# Patient Record
Sex: Female | Born: 1995 | Race: White | Hispanic: No | Marital: Single | Smoking: Never smoker
Health system: Southern US, Community
[De-identification: ages and names within clinical notes are randomized; demographics above are authoritative.]

## PROBLEM LIST (undated history)

## (undated) DIAGNOSIS — R51 Headache: Secondary | ICD-10-CM

## (undated) DIAGNOSIS — F32A Depression, unspecified: Secondary | ICD-10-CM

## (undated) HISTORY — DX: Depression, unspecified: F32.A

## (undated) HISTORY — DX: Headache: R51

---
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
## 2017-02-17 HISTORY — PX: APPENDECTOMY: SHX54

## 2017-12-15 ENCOUNTER — Emergency Department: Payer: BLUE CROSS/BLUE SHIELD

## 2017-12-15 ENCOUNTER — Inpatient Hospital Stay
Admission: EM | Admit: 2017-12-15 | Discharge: 2017-12-19 | DRG: 373 | Disposition: A | Discharge status: Home / Self Care | Payer: BLUE CROSS/BLUE SHIELD | Attending: Surgery | Admitting: Surgery

## 2017-12-15 ENCOUNTER — Encounter: Payer: Self-pay | Admitting: Emergency Medicine

## 2017-12-15 ENCOUNTER — Other Ambulatory Visit: Payer: Self-pay

## 2017-12-15 DIAGNOSIS — K3532 Acute appendicitis with perforation and localized peritonitis, without abscess: Secondary | ICD-10-CM | POA: Diagnosis not present

## 2017-12-15 DIAGNOSIS — Z880 Allergy status to penicillin: Secondary | ICD-10-CM

## 2017-12-15 DIAGNOSIS — Z6837 Body mass index (BMI) 37.0-37.9, adult: Secondary | ICD-10-CM | POA: Diagnosis not present

## 2017-12-15 DIAGNOSIS — K5900 Constipation, unspecified: Secondary | ICD-10-CM | POA: Diagnosis present

## 2017-12-15 DIAGNOSIS — K3580 Unspecified acute appendicitis: Secondary | ICD-10-CM | POA: Diagnosis present

## 2017-12-15 DIAGNOSIS — R51 Headache: Secondary | ICD-10-CM | POA: Diagnosis present

## 2017-12-15 DIAGNOSIS — E669 Obesity, unspecified: Secondary | ICD-10-CM | POA: Diagnosis present

## 2017-12-15 DIAGNOSIS — Z882 Allergy status to sulfonamides status: Secondary | ICD-10-CM

## 2017-12-15 LAB — COMPREHENSIVE METABOLIC PANEL
ALK PHOS: 69 U/L (ref 38–126)
ALT: 16 U/L (ref 0–44)
AST: 16 U/L (ref 15–41)
Albumin: 4.2 g/dL (ref 3.5–5.0)
Anion gap: 9 (ref 5–15)
BUN: 9 mg/dL (ref 6–20)
CALCIUM: 9.3 mg/dL (ref 8.9–10.3)
CHLORIDE: 102 mmol/L (ref 98–111)
CO2: 27 mmol/L (ref 22–32)
CREATININE: 0.58 mg/dL (ref 0.44–1.00)
GFR calc Af Amer: >60 mL/min (ref >60)
GFR calc non Af Amer: >60 mL/min (ref >60)
GLUCOSE: 95 mg/dL (ref 70–99)
Potassium: 3.9 mmol/L (ref 3.5–5.1)
SODIUM: 138 mmol/L (ref 135–145)
Total Bilirubin: 0.6 mg/dL (ref 0.3–1.2)
Total Protein: 7.1 g/dL (ref 6.5–8.1)

## 2017-12-15 LAB — URINALYSIS, COMPLETE (UACMP) WITH MICROSCOPIC
BILIRUBIN URINE: NEGATIVE
Bacteria, UA: NONE SEEN
GLUCOSE, UA: NEGATIVE mg/dL
Ketones, ur: 20 mg/dL — AB
NITRITE: NEGATIVE
PH: 6 (ref 5.0–8.0)
Protein, ur: NEGATIVE mg/dL
SPECIFIC GRAVITY, URINE: 1.017 (ref 1.005–1.030)

## 2017-12-15 LAB — CBC
HCT: 40 % (ref 36.0–46.0)
Hemoglobin: 12.8 g/dL (ref 12.0–15.0)
MCH: 27.5 pg (ref 26.0–34.0)
MCHC: 32 g/dL (ref 30.0–36.0)
MCV: 86 fL (ref 80.0–100.0)
PLATELETS: 311 10*3/uL (ref 150–400)
RBC: 4.65 MIL/uL (ref 3.87–5.11)
RDW: 13.2 % (ref 11.5–15.5)
WBC: 13.9 10*3/uL — ABNORMAL HIGH (ref 4.0–10.5)
nRBC: 0 % (ref 0.0–0.2)

## 2017-12-15 LAB — POCT PREGNANCY, URINE: Preg Test, Ur: NEGATIVE

## 2017-12-15 LAB — LIPASE, BLOOD: LIPASE: 23 U/L (ref 11–51)

## 2017-12-15 MED ORDER — IOPAMIDOL (ISOVUE-300) INJECTION 61%
100.0000 mL | Freq: Once | INTRAVENOUS | Status: AC | PRN
  Administered 2017-12-15: 100 mL via INTRAVENOUS
  Filled 2017-12-15: qty 100

## 2017-12-15 MED ORDER — MEROPENEM 1 G IV SOLR
1.0000 g | Freq: Three times a day (TID) | INTRAVENOUS | Status: DC
  Administered 2017-12-16 – 2017-12-18 (×7): 1 g via INTRAVENOUS
  Filled 2017-12-15 (×10): qty 1

## 2017-12-15 NOTE — Consult Note (Signed)
Subjective:   CC: perforated appendicitis  HPI:  Taylor Wang is a 22 y.o. female who is consulted by [facility] for evaluation of  above cc.  Symptoms were first noted 10 days ago. Pain is sharp, waxing and waning  Associated with constipation, exacerbated by movement.  Last BM couple days ago.  Some associated nausea but no emesis.     Past Medical History:  has a past medical history of Headache(784.0).  Past Surgical History:  has a past surgical history that includes Adenoidectomy, tonsillectomy and myringotomy with tube placement (Bilateral, 05/2002) and Tympanostomy tube placement (Bilateral, 06/2006).  Family History: family history includes ADD / ADHD in her sister; Migraines in her maternal aunt.  Social History:  reports that she has never smoked. She has never used smokeless tobacco. She reports that she does not drink alcohol or use drugs.  Current Medications: none reported  Allergies:  Allergies as of 12/15/2017 - Review Complete 12/15/2017  Allergen Reaction Noted  . Other  03/17/2013  . Penicillins Swelling 12/15/2017  . Sulfa antibiotics Swelling 12/15/2017    ROS:  A 15 point review of systems was performed and pertinent positives and negatives noted in HPI    Objective:     BP (!) 145/81 (BP Location: Left Arm)   Pulse 87   Temp 98.6 F (37 C) (Oral)   Resp 18   Ht 5\' 5"  (1.651 m)   Wt 103 kg   LMP 11/04/2017 (Approximate)   SpO2 97%   BMI 37.77 kg/m    Constitutional :  alert, cooperative, appears stated age, no distress and moderately obese  Lymphatics/Throat:  no asymmetry, masses, or scars  Respiratory:  clear to auscultation bilaterally  Cardiovascular:  regular rate and rhythm  Gastrointestinal: soft, no guarding, but focal tenderness in RLQ with some pain with movement.   Musculoskeletal: Steady gait and movement  Skin: Cool and moist  Psychiatric: Normal affect, non-agitated, not confused       LABS:  CMP Latest Ref Rng & Units  12/15/2017  Glucose 70 - 99 mg/dL 95  BUN 6 - 20 mg/dL 9  Creatinine 9.62 - 9.52 mg/dL 8.41  Sodium 324 - 401 mmol/L 138  Potassium 3.5 - 5.1 mmol/L 3.9  Chloride 98 - 111 mmol/L 102  CO2 22 - 32 mmol/L 27  Calcium 8.9 - 10.3 mg/dL 9.3  Total Protein 6.5 - 8.1 g/dL 7.1  Total Bilirubin 0.3 - 1.2 mg/dL 0.6  Alkaline Phos 38 - 126 U/L 69  AST 15 - 41 U/L 16  ALT 0 - 44 U/L 16   CBC Latest Ref Rng & Units 12/15/2017  WBC 4.0 - 10.5 K/uL 13.9(H)  Hemoglobin 12.0 - 15.0 g/dL 02.7  Hematocrit 25.3 - 46.0 % 40.0  Platelets 150 - 400 K/uL 311     RADS: Pending final read by review by myself looks like perforated appendicitis with phelgmon, no obviously organized abscess, no free air. Assessment:      Perforated appendicitis, no signs of overt peritonitis.  Plan:      Due to perforation, will forego operative intervention and start IV abx and serial abdominal exams.  Will consider repeat CT prior to d/c to see if phelgmon forms into drainable organized abscess.  NPO for tonight.

## 2017-12-15 NOTE — ED Provider Notes (Signed)
[facility] Emergency Department Provider Note       Time seen: ----------------------------------------- 9:22 PM on 12/15/2017 -----------------------------------------   I have reviewed the triage vital signs and the nursing notes.  HISTORY   Chief Complaint Abdominal Pain    HPI MAN BONNEAU is a 22 y.o. female with a history of headaches who presents to the ED for right lower quadrant pain for the past 10 days.  She denies any  vomiting or diarrhea.  Pain is worse with palpation or movement.  [facility] and told to come to the ER for further evaluation.  She has had some chills.  She denies any vaginal bleeding or discharge.  Past Medical History:  Diagnosis Date  . ZOXWRUEA(540.9)     Patient Active Problem List   Diagnosis Date Noted  . Migraine without aura and without status migrainosus, not intractable 03/17/2013  . Tension headache 03/17/2013  . Chronic daily headache 03/17/2013    Past Surgical History:  Procedure Laterality Date  . ADENOIDECTOMY, TONSILLECTOMY AND MYRINGOTOMY WITH TUBE PLACEMENT Bilateral 05/2002  . TYMPANOSTOMY TUBE PLACEMENT Bilateral 06/2006    Allergies Other; Penicillins; and Sulfa antibiotics  Social History Social History   Tobacco Use  . Smoking status: Never Smoker  . Smokeless tobacco: Never Used  Substance Use Topics  . Alcohol use: No  . Drug use: No   Review of Systems Constitutional: Negative for fever. Cardiovascular: Negative for chest pain. Respiratory: Negative for shortness of breath. Gastrointestinal: Positive right lower quadrant abdominal pain Genitourinary: Negative for dysuria. Musculoskeletal: Negative for back pain. Skin: Negative for rash. Neurological: Negative for headaches, focal weakness or numbness.  All systems negative/normal/unremarkable except as stated in the HPI  ____________________________________________   PHYSICAL EXAM:  VITAL  SIGNS: ED Triage Vitals [12/15/17 2008]  Enc Vitals Group     BP (!) 145/81     Pulse Rate 87     Resp 18     Temp 98.6 F (37 C)     Temp Source Oral     SpO2 97 %     Weight 227 lb (103 kg)     Height 5\' 5"  (1.651 m)     Head Circumference      Peak Flow      Pain Score 9     Pain Loc      Pain Edu?      Excl. in GC?    Constitutional: Alert and oriented. Well appearing and in no distress. Eyes: Conjunctivae are normal. Normal extraocular movements. ENT   Head: Normocephalic and atraumatic.   Nose: No congestion/rhinnorhea.   Mouth/Throat: Mucous membranes are moist.   Neck: No stridor. Cardiovascular: Normal rate, regular rhythm. No murmurs, rubs, or gallops. Respiratory: Normal respiratory effort without tachypnea nor retractions. Breath sounds are clear and equal bilaterally. No wheezes/rales/rhonchi. Gastrointestinal: Right lower quadrant tenderness, no rebound or guarding.  Normal bowel sounds. Musculoskeletal: Nontender with normal range of motion in extremities. No lower extremity tenderness nor edema. Neurologic:  Normal speech and language. No gross focal neurologic deficits are appreciated.  Skin:  Skin is warm, dry and intact. No rash noted. Psychiatric: Mood and affect are normal. Speech and behavior are normal.  ____________________________________________  ED COURSE:  As part of my medical decision making, I reviewed the following data within the electronic MEDICAL RECORD NUMBER History obtained from family if available, nursing notes, old chart and ekg, as well as notes from prior ED visits.  Patient presented for right lower quadrant abdominal pain, we will assess with labs and imaging as indicated at this time.   Procedures ____________________________________________   LABS (pertinent positives/negatives)  Labs Reviewed  CBC - Abnormal; Notable for the following components:      Result Value   WBC 13.9 (*)    All other components within  normal limits  URINALYSIS, COMPLETE (UACMP) WITH MICROSCOPIC - Abnormal; Notable for the following components:   Color, Urine YELLOW (*)    APPearance CLEAR (*)    Hgb urine dipstick SMALL (*)    Ketones, ur 20 (*)    Leukocytes, UA TRACE (*)    All other components within normal limits  LIPASE, BLOOD  COMPREHENSIVE METABOLIC PANEL  POCT PREGNANCY, URINE  POC URINE PREG, ED    RADIOLOGY Images were viewed by me  CT of the abdomen pelvis with contrast Reveals appendicitis ____________________________________________  DIFFERENTIAL DIAGNOSIS   Renal colic, UTI, pyelonephritis, appendicitis, ovarian cyst, torsion  FINAL ASSESSMENT AND PLAN  Right lower quadrant pain, appendicitis   Plan: The patient had presented for right lower quadrant pain. Patient's labs did reveal leukocytosis with a white count of 13.9. Patient's imaging did reveal a right lower quadrant appendicitis.  We have ordered IV meropenem for her due to her penicillin allergy.  I have discussed with general surgery for admission.   Ulice Dash, MD   Note: This note was generated in part or whole with voice recognition software. Voice recognition is usually quite accurate but there are transcription errors that can and very often do occur. I apologize for any typographical errors that were not detected and corrected.     Emily Filbert, MD 12/15/17 2206

## 2017-12-15 NOTE — ED Triage Notes (Signed)
Pt presents to ED with right lower abd pain for the past 10+/- days. Denies vomiting. Tender with palpation. [facility] and told to come to ED for further evaluation.

## 2017-12-15 NOTE — ED Notes (Signed)
Sherilyn Cooter RN called the pharmacy to request the ordered medication for the Pt. Pharmacy states that they will send it if they have it. They will call if they do not have it.

## 2017-12-16 ENCOUNTER — Other Ambulatory Visit: Payer: Self-pay

## 2017-12-16 LAB — BASIC METABOLIC PANEL
Anion gap: 10 (ref 5–15)
BUN: 6 mg/dL (ref 6–20)
CHLORIDE: 104 mmol/L (ref 98–111)
CO2: 26 mmol/L (ref 22–32)
CREATININE: 0.47 mg/dL (ref 0.44–1.00)
Calcium: 8.9 mg/dL (ref 8.9–10.3)
GFR calc Af Amer: >60 mL/min (ref >60)
GLUCOSE: 83 mg/dL (ref 70–99)
Potassium: 3.4 mmol/L — ABNORMAL LOW (ref 3.5–5.1)
SODIUM: 140 mmol/L (ref 135–145)

## 2017-12-16 LAB — CBC
HEMATOCRIT: 37 % (ref 36.0–46.0)
Hemoglobin: 12.1 g/dL (ref 12.0–15.0)
MCH: 27.8 pg (ref 26.0–34.0)
MCHC: 32.7 g/dL (ref 30.0–36.0)
MCV: 85.1 fL (ref 80.0–100.0)
PLATELETS: 282 10*3/uL (ref 150–400)
RBC: 4.35 MIL/uL (ref 3.87–5.11)
RDW: 13.2 % (ref 11.5–15.5)
WBC: 13.1 10*3/uL — ABNORMAL HIGH (ref 4.0–10.5)
nRBC: 0 % (ref 0.0–0.2)

## 2017-12-16 LAB — MAGNESIUM: Magnesium: 2.2 mg/dL (ref 1.7–2.4)

## 2017-12-16 LAB — PHOSPHORUS: Phosphorus: 2.9 mg/dL (ref 2.5–4.6)

## 2017-12-16 MED ORDER — ONDANSETRON 4 MG PO TBDP: 4.0000 mg | ORAL_TABLET | Freq: Four times a day (QID) | ORAL | Status: DC | PRN

## 2017-12-16 MED ORDER — TRAMADOL HCL 50 MG PO TABS: 50.0000 mg | ORAL_TABLET | Freq: Four times a day (QID) | ORAL | Status: DC | PRN

## 2017-12-16 MED ORDER — ACETAMINOPHEN 325 MG PO TABS
650.0000 mg | ORAL_TABLET | Freq: Four times a day (QID) | ORAL | Status: DC | PRN
  Administered 2017-12-16 – 2017-12-18 (×3): 650 mg via ORAL
  Filled 2017-12-16 (×3): qty 2

## 2017-12-16 MED ORDER — HYDROCODONE-ACETAMINOPHEN 5-325 MG PO TABS: 1.0000 | ORAL_TABLET | ORAL | Status: DC | PRN

## 2017-12-16 MED ORDER — FLUTICASONE PROPIONATE 50 MCG/ACT NA SUSP
2.0000 | Freq: Every day | NASAL | Status: DC | PRN
  Filled 2017-12-16: qty 16

## 2017-12-16 MED ORDER — DOCUSATE SODIUM 100 MG PO CAPS
100.0000 mg | ORAL_CAPSULE | Freq: Two times a day (BID) | ORAL | Status: DC | PRN
  Administered 2017-12-18: 100 mg via ORAL
  Filled 2017-12-16: qty 1

## 2017-12-16 MED ORDER — ONDANSETRON HCL 4 MG/2ML IJ SOLN: 4.0000 mg | Freq: Four times a day (QID) | INTRAMUSCULAR | Status: DC | PRN

## 2017-12-16 MED ORDER — LACTATED RINGERS IV SOLN
INTRAVENOUS | Status: DC
  Administered 2017-12-16 – 2017-12-17 (×5): via INTRAVENOUS

## 2017-12-16 MED ORDER — PROPRANOLOL HCL 20 MG PO TABS
30.0000 mg | ORAL_TABLET | Freq: Two times a day (BID) | ORAL | Status: DC
  Filled 2017-12-16 (×3): qty 1

## 2017-12-16 MED ORDER — ENOXAPARIN SODIUM 40 MG/0.4ML ~~LOC~~ SOLN
40.0000 mg | SUBCUTANEOUS | Status: DC
  Administered 2017-12-16 – 2017-12-18 (×3): 40 mg via SUBCUTANEOUS
  Filled 2017-12-16 (×3): qty 0.4

## 2017-12-16 MED ORDER — MORPHINE SULFATE (PF) 2 MG/ML IV SOLN
2.0000 mg | INTRAVENOUS | Status: DC | PRN
  Administered 2017-12-16 (×4): 2 mg via INTRAVENOUS
  Filled 2017-12-16 (×4): qty 1

## 2017-12-16 MED ORDER — LORATADINE 10 MG PO TABS
10.0000 mg | ORAL_TABLET | Freq: Every day | ORAL | Status: DC
  Filled 2017-12-16 (×4): qty 1

## 2017-12-16 NOTE — Progress Notes (Signed)
Subjective:  CC:  Taylor Wang is a 22 y.o. female  Hospital stay day 1,   perforated appendicitis  HPI: No acute issues overnight.  Feeling hungry.  ROS:  A 5 point review of systems was performed and pertinent positives and negatives noted in HPI.   Objective:      Temp:  [98.2 F (36.8 C)-98.6 F (37 C)] 98.4 F (36.9 C) (10/30 0841) Pulse Rate:  [77-98] 98 (10/30 0841) Resp:  [16-18] 18 (10/30 0841) BP: (100-145)/(57-81) 112/74 (10/30 0841) SpO2:  [96 %-100 %] 99 % (10/30 0841) Weight:  [161 kg] 103 kg (10/29 2008)     Height: 5\' 5"  (165.1 cm) Weight: 103 kg BMI (Calculated): 37.77   Intake/Output this shift:  Total I/O In: -  Out: 150 [Urine:150]       Constitutional :  alert, cooperative, appears stated age and no distress  Respiratory:  clear to auscultation bilaterally  Cardiovascular:  regular rate and rhythm  Gastrointestinal: soft, but still some tenderness with guarding in RLQ, suprapubic region.   Skin: Cool and moist.   Psychiatric: Normal affect, non-agitated, not confused       LABS:  CMP Latest Ref Rng & Units 12/16/2017 12/15/2017  Glucose 70 - 99 mg/dL 83 95  BUN 6 - 20 mg/dL 6 9  Creatinine 0.96 - 1.00 mg/dL 0.45 4.09  Sodium 811 - 145 mmol/L 140 138  Potassium 3.5 - 5.1 mmol/L 3.4(L) 3.9  Chloride 98 - 111 mmol/L 104 102  CO2 22 - 32 mmol/L 26 27  Calcium 8.9 - 10.3 mg/dL 8.9 9.3  Total Protein 6.5 - 8.1 g/dL - 7.1  Total Bilirubin 0.3 - 1.2 mg/dL - 0.6  Alkaline Phos 38 - 126 U/L - 69  AST 15 - 41 U/L - 16  ALT 0 - 44 U/L - 16   CBC Latest Ref Rng & Units 12/16/2017 12/15/2017  WBC 4.0 - 10.5 K/uL 13.1(H) 13.9(H)  Hemoglobin 12.0 - 15.0 g/dL 91.4 78.2  Hematocrit 95.6 - 46.0 % 37.0 40.0  Platelets 150 - 400 K/uL 282 311    RADS: n/a Assessment:   Perforated appendicitis.  Will continue with serial abdominal exams and IV abx, am labs.  Gum and hard candy ok for now.  May advance to clears later today.    NO plan for  surgery at this point.  Pathophisiology of appendicitis and risk/benefits/alternatives to IV therapy vs surgical intervention(both acute and interval appy) explained in great detail to mother and patient.  All questions answered at this point and both are in agreement with continuing IV abx for now.  I specifically stated they can always reach out to me with any additional questions/concerns since there was some initial confusion about why surgery was not performed right away.

## 2017-12-17 LAB — CBC
HCT: 37 % (ref 36.0–46.0)
HEMOGLOBIN: 12.2 g/dL (ref 12.0–15.0)
MCH: 27.9 pg (ref 26.0–34.0)
MCHC: 33 g/dL (ref 30.0–36.0)
MCV: 84.5 fL (ref 80.0–100.0)
PLATELETS: 257 10*3/uL (ref 150–400)
RBC: 4.38 MIL/uL (ref 3.87–5.11)
RDW: 12.8 % (ref 11.5–15.5)
WBC: 12.9 10*3/uL — AB (ref 4.0–10.5)
nRBC: 0 % (ref 0.0–0.2)

## 2017-12-17 LAB — BASIC METABOLIC PANEL
ANION GAP: 11 (ref 5–15)
BUN: <5 mg/dL — ABNORMAL LOW (ref 6–20)
CALCIUM: 8.4 mg/dL — AB (ref 8.9–10.3)
CO2: 21 mmol/L — ABNORMAL LOW (ref 22–32)
CREATININE: 0.38 mg/dL — AB (ref 0.44–1.00)
Chloride: 104 mmol/L (ref 98–111)
GFR calc Af Amer: >60 mL/min (ref >60)
GLUCOSE: 72 mg/dL (ref 70–99)
Potassium: 3.6 mmol/L (ref 3.5–5.1)
Sodium: 136 mmol/L (ref 135–145)

## 2017-12-17 LAB — HIV ANTIBODY (ROUTINE TESTING W REFLEX): HIV SCREEN 4TH GENERATION: NONREACTIVE

## 2017-12-17 LAB — PHOSPHORUS: Phosphorus: 2.2 mg/dL — ABNORMAL LOW (ref 2.5–4.6)

## 2017-12-17 LAB — MAGNESIUM: Magnesium: 2 mg/dL (ref 1.7–2.4)

## 2017-12-17 MED ORDER — SODIUM PHOSPHATES 45 MMOLE/15ML IV SOLN
10.0000 mmol | Freq: Once | INTRAVENOUS | Status: AC
  Administered 2017-12-17: 10 mmol via INTRAVENOUS
  Filled 2017-12-17: qty 3.33

## 2017-12-17 NOTE — Progress Notes (Signed)
Responded to PIV consult. RN states PIV obtained by another nurse.

## 2017-12-18 LAB — CBC
HEMATOCRIT: 36.5 % (ref 36.0–46.0)
Hemoglobin: 11.9 g/dL — ABNORMAL LOW (ref 12.0–15.0)
MCH: 27.5 pg (ref 26.0–34.0)
MCHC: 32.6 g/dL (ref 30.0–36.0)
MCV: 84.5 fL (ref 80.0–100.0)
Platelets: 279 10*3/uL (ref 150–400)
RBC: 4.32 MIL/uL (ref 3.87–5.11)
RDW: 12.8 % (ref 11.5–15.5)
WBC: 8.2 10*3/uL (ref 4.0–10.5)
nRBC: 0 % (ref 0.0–0.2)

## 2017-12-18 LAB — BASIC METABOLIC PANEL
ANION GAP: 10 (ref 5–15)
BUN: <5 mg/dL — ABNORMAL LOW (ref 6–20)
CALCIUM: 8.5 mg/dL — AB (ref 8.9–10.3)
CO2: 25 mmol/L (ref 22–32)
Chloride: 104 mmol/L (ref 98–111)
Creatinine, Ser: 0.39 mg/dL — ABNORMAL LOW (ref 0.44–1.00)
GFR calc Af Amer: >60 mL/min (ref >60)
GLUCOSE: 77 mg/dL (ref 70–99)
Potassium: 3.1 mmol/L — ABNORMAL LOW (ref 3.5–5.1)
Sodium: 139 mmol/L (ref 135–145)

## 2017-12-18 LAB — MAGNESIUM: Magnesium: 2 mg/dL (ref 1.7–2.4)

## 2017-12-18 LAB — PHOSPHORUS: Phosphorus: 2.6 mg/dL (ref 2.5–4.6)

## 2017-12-18 MED ORDER — METRONIDAZOLE 500 MG PO TABS
500.0000 mg | ORAL_TABLET | Freq: Three times a day (TID) | ORAL | Status: DC
  Administered 2017-12-18 – 2017-12-19 (×3): 500 mg via ORAL
  Filled 2017-12-18 (×4): qty 1

## 2017-12-18 MED ORDER — POTASSIUM CHLORIDE CRYS ER 20 MEQ PO TBCR
30.0000 meq | EXTENDED_RELEASE_TABLET | ORAL | Status: AC
  Administered 2017-12-18 (×2): 30 meq via ORAL
  Filled 2017-12-18 (×2): qty 2

## 2017-12-18 MED ORDER — CIPROFLOXACIN HCL 500 MG PO TABS
500.0000 mg | ORAL_TABLET | Freq: Two times a day (BID) | ORAL | Status: DC
  Administered 2017-12-18 (×2): 500 mg via ORAL
  Filled 2017-12-18 (×3): qty 1

## 2017-12-18 NOTE — Progress Notes (Signed)
Spoke with Dr. Tonna Boehringer about plan of care for patient today: start regular diet, change to PO antibiotic, PO potassium replacement. Patient updated on current plan of care.

## 2017-12-18 NOTE — Progress Notes (Signed)
Subjective:  CC:  Taylor Wang is a 22 y.o. female  Hospital stay day 3,   perforated appendicitis  HPI: No acute issues overnight.  Denies any pain this am.  Tolerated clears yesterday.  ROS:  A 5 point review of systems was performed and pertinent positives and negatives noted in HPI.   Objective:      Temp:  [97.9 F (36.6 C)-98.6 F (37 C)] 98.5 F (36.9 C) (11/01 0904) Pulse Rate:  [72-86] 72 (11/01 0904) Resp:  [18-20] 18 (11/01 0904) BP: (108-114)/(62-78) 112/67 (11/01 0904) SpO2:  [99 %-100 %] 99 % (11/01 0904)     Height: 5\' 5"  (165.1 cm) Weight: 103 kg BMI (Calculated): 37.77   Intake/Output this shift:  Total I/O In: -  Out: 1700 [Urine:1700]       Constitutional :  alert, cooperative, appears stated age and no distress  Respiratory:  clear to auscultation bilaterally  Cardiovascular:  regular rate and rhythm  Gastrointestinal: soft, non-tender; bowel sounds normal; no masses,  no organomegaly.   Skin: Cool and moist.   Psychiatric: Normal affect, non-agitated, not confused       LABS:  CMP Latest Ref Rng & Units 12/18/2017 12/17/2017 12/16/2017  Glucose 70 - 99 mg/dL 77 72 83  BUN 6 - 20 mg/dL <4(I) <3(K) 6  Creatinine 0.44 - 1.00 mg/dL 7.42(V) 9.56(L) 8.75  Sodium 135 - 145 mmol/L 139 136 140  Potassium 3.5 - 5.1 mmol/L 3.1(L) 3.6 3.4(L)  Chloride 98 - 111 mmol/L 104 104 104  CO2 22 - 32 mmol/L 25 21(L) 26  Calcium 8.9 - 10.3 mg/dL 6.4(P) 3.2(R) 8.9  Total Protein 6.5 - 8.1 g/dL - - -  Total Bilirubin 0.3 - 1.2 mg/dL - - -  Alkaline Phos 38 - 126 U/L - - -  AST 15 - 41 U/L - - -  ALT 0 - 44 U/L - - -   CBC Latest Ref Rng & Units 12/18/2017 12/17/2017 12/16/2017  WBC 4.0 - 10.5 K/uL 8.2 12.9(H) 13.1(H)  Hemoglobin 12.0 - 15.0 g/dL 11.9(L) 12.2 12.1  Hematocrit 36.0 - 46.0 % 36.5 37.0 37.0  Platelets 150 - 400 K/uL 279 257 282    RADS: n/a Assessment:   Perforated appendicitis.  Doing well.  Advance to regular and switched to PO abx.  If  no worsening and tolerating regular diet, will likely discharge tomorrow am.

## 2017-12-19 LAB — BASIC METABOLIC PANEL
ANION GAP: 10 (ref 5–15)
BUN: <5 mg/dL — ABNORMAL LOW (ref 6–20)
CO2: 26 mmol/L (ref 22–32)
Calcium: 9.1 mg/dL (ref 8.9–10.3)
Chloride: 104 mmol/L (ref 98–111)
Creatinine, Ser: 0.55 mg/dL (ref 0.44–1.00)
GFR calc non Af Amer: >60 mL/min (ref >60)
Glucose, Bld: 94 mg/dL (ref 70–99)
Potassium: 3.6 mmol/L (ref 3.5–5.1)
SODIUM: 140 mmol/L (ref 135–145)

## 2017-12-19 LAB — CBC
HEMATOCRIT: 40.8 % (ref 36.0–46.0)
HEMOGLOBIN: 13.4 g/dL (ref 12.0–15.0)
MCH: 27.4 pg (ref 26.0–34.0)
MCHC: 32.8 g/dL (ref 30.0–36.0)
MCV: 83.4 fL (ref 80.0–100.0)
Platelets: 310 10*3/uL (ref 150–400)
RBC: 4.89 MIL/uL (ref 3.87–5.11)
RDW: 13.2 % (ref 11.5–15.5)
WBC: 7.6 10*3/uL (ref 4.0–10.5)
nRBC: 0 % (ref 0.0–0.2)

## 2017-12-19 LAB — PHOSPHORUS: PHOSPHORUS: 3.8 mg/dL (ref 2.5–4.6)

## 2017-12-19 LAB — MAGNESIUM: MAGNESIUM: 2 mg/dL (ref 1.7–2.4)

## 2017-12-19 MED ORDER — DOCUSATE SODIUM 100 MG PO CAPS: 100.0000 mg | ORAL_CAPSULE | Freq: Two times a day (BID) | ORAL | 0 refills | Status: DC | PRN

## 2017-12-19 MED ORDER — ACETAMINOPHEN 325 MG PO TABS: 650.0000 mg | ORAL_TABLET | Freq: Four times a day (QID) | ORAL | 0 refills | Status: AC | PRN

## 2017-12-19 MED ORDER — CIPROFLOXACIN HCL 500 MG PO TABS: 500.0000 mg | ORAL_TABLET | Freq: Two times a day (BID) | ORAL | 0 refills | Status: AC

## 2017-12-19 MED ORDER — HYDROCODONE-ACETAMINOPHEN 5-325 MG PO TABS: 1.0000 | ORAL_TABLET | ORAL | 0 refills | Status: DC | PRN

## 2017-12-19 MED ORDER — METRONIDAZOLE 500 MG PO TABS: 500.0000 mg | ORAL_TABLET | Freq: Three times a day (TID) | ORAL | 0 refills | Status: AC

## 2017-12-19 NOTE — Progress Notes (Signed)
Discharge instructions complete and prescriptions sent to pharmacy by provider. Work excuse given to patient to return to work 12/21/17, per provider. Patient verbalizes understanding of teaching. Patient discharged home via wheelchair at 0915.

## 2017-12-19 NOTE — Plan of Care (Signed)
Patient's vital signs stable; saline lock intact and patent; site clear; voiding; good appetite; good po fluids; denies pain this shift; po flagyl and po cipro continued; no nausea; plans for discharge today.

## 2017-12-19 NOTE — Discharge Instructions (Signed)
-  Tylenol and advil as needed for discomfort.  Please alternate between the two every four hours as needed for pain.    -Use narcotics, if prescribed, only when tylenol and motrin is not enough to control pain.  -325-650mg  every 8hrs to max of 4000mg /24hrs (including the 325mg  in every norco dose) for the tylenol.    -Advil up to 800mg  per dose every 8hrs as needed for pain.    - Call office during normal business hours for sudden increase in pain, nausea/vomiting, and/or accompanying fever.  Go to nearest urgent care/ED for afterhour care.

## 2017-12-19 NOTE — Discharge Summary (Signed)
Physician Discharge Summary  Patient ID: Taylor Wang MRN: [id number] DOB/AGE: 1995/12/28 22 y.o.  Admit date: 12/15/2017 Discharge date: 12/19/2017  Admission Diagnoses: perforated appendicitis   Discharge Diagnoses:  Same as above  Discharged Condition: good  Hospital Course: admitted for IV abx treatment of perforated appendicitis.  Pain resolved and diet resumed without any issue, leukocystosis resolved.  D/c on oral abx and f/u in office for discussion of interval lap appy.  Consults: None  Discharge Exam: Blood pressure 111/71, pulse 63, temperature 98.1 F (36.7 C), temperature source Oral, resp. rate 18, height 5\' 5"  (1.651 m), weight 103 kg, last menstrual period 11/04/2017, SpO2 98 %. General appearance: alert, cooperative and no distress GI: soft, non-tender; bowel sounds normal; no masses,  no organomegaly  Disposition:  Discharge disposition: 01-Home or Self Care       Discharge Instructions    Discharge patient   Complete by:  As directed    Discharge disposition:  01-Home or Self Care   Discharge patient date:  12/19/2017     Allergies as of 12/19/2017      Reactions   Other    Seasonal Allergies   Penicillins Swelling   Sulfa Antibiotics Swelling      Medication List    STOP taking these medications   propranolol 20 MG tablet Commonly known as:  INDERAL     TAKE these medications   acetaminophen 325 MG tablet Commonly known as:  TYLENOL Take 2 tablets (650 mg total) by mouth every 6 (six) hours as needed for up to 10 days for headache.   cetirizine 10 MG tablet Commonly known as:  ZYRTEC Take 10 mg by mouth daily as needed for allergies.   ciprofloxacin 500 MG tablet Commonly known as:  CIPRO Take 1 tablet (500 mg total) by mouth 2 (two) times daily for 7 days.   docusate sodium 100 MG capsule Commonly known as:  COLACE Take 1 capsule (100 mg total) by mouth 2 (two) times daily as needed for mild constipation.   fluticasone 50  MCG/ACT nasal spray Commonly known as:  FLONASE Place 2 sprays into both nostrils daily as needed for allergies or rhinitis.   HYDROcodone-acetaminophen 5-325 MG tablet Commonly known as:  NORCO/VICODIN Take 1-2 tablets by mouth every 4 (four) hours as needed for moderate pain.   ibuprofen 800 MG tablet Commonly known as:  ADVIL,MOTRIN Take 800 mg by mouth every 8 (eight) hours as needed.   Magnesium Oxide 500 MG Tabs Take by mouth.   metroNIDAZOLE 500 MG tablet Commonly known as:  FLAGYL Take 1 tablet (500 mg total) by mouth 3 (three) times daily for 7 days.   riboflavin 100 MG Tabs tablet Commonly known as:  VITAMIN B-2 Take 100 mg by mouth daily.      Follow-up Information    Tonna Boehringer, Valerie Cones, DO Follow up in 2 week(s).   Specialty:  Surgery Contact information: [address] [place] [address] [phone]            Total time spent arranging discharge was >25min. Signed: Sung Amabile 12/19/2017, 1:30 PM

## 2018-01-06 DIAGNOSIS — K353 Acute appendicitis with localized peritonitis, without perforation or gangrene: Secondary | ICD-10-CM

## 2018-01-06 HISTORY — DX: Acute appendicitis with localized peritonitis, without perforation or gangrene: K35.30

## 2018-12-24 IMAGING — CT CT ABD-PELV W/ CM
2 of 4 series · 15 of 46 positions shown, 17 images · IV contrast (APPLIED)
Comparison: None.

CLINICAL DATA: Lower abdominal pain for 10 days. Suspect
appendicitis.

EXAM:
CT ABDOMEN AND PELVIS WITH CONTRAST
TECHNIQUE: Multidetector CT imaging of the abdomen and pelvis was performed
using the standard protocol following bolus administration of
intravenous contrast.
CONTRAST:  100mL JXKZ25-XBB IOPAMIDOL (JXKZ25-XBB) INJECTION 61%

[Series 2: axial st · axial · 0.98mm/px · z∈[-749,-309]mm · 12 of 101 slices shown, 14 images]
[im 9/101  soft-tissue]
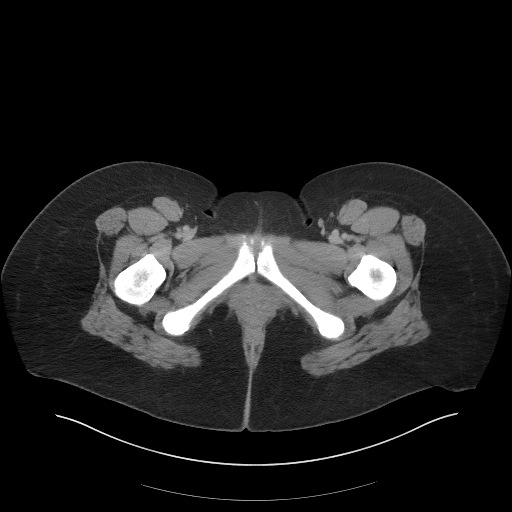
[im 9/101  bone]
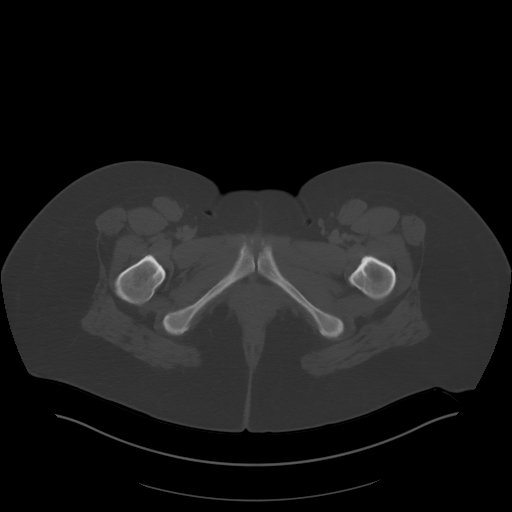
[im 17/101  soft-tissue]
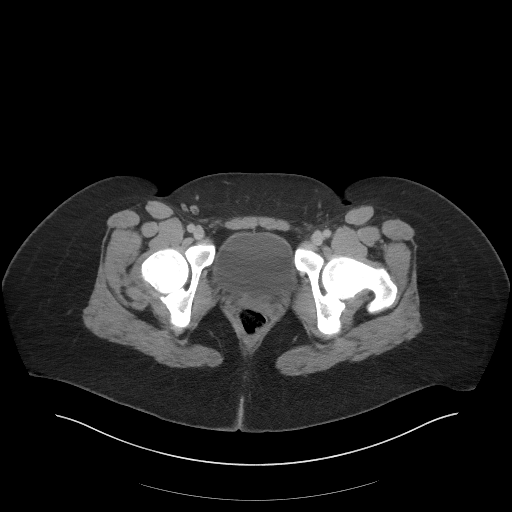
[im 25/101  soft-tissue]
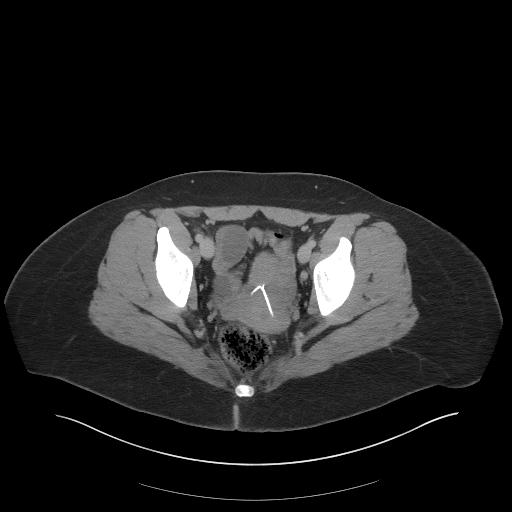
[im 33/101  soft-tissue]
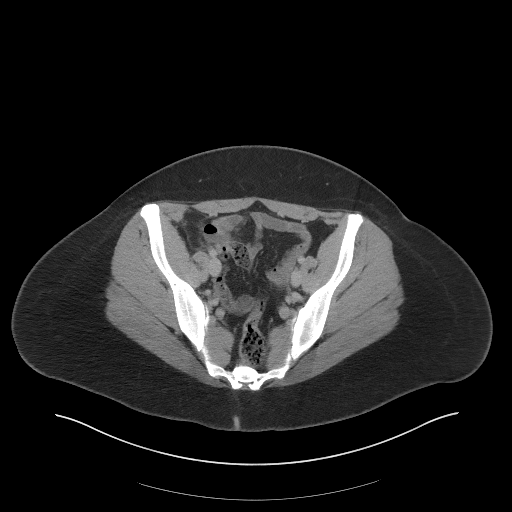
[im 41/101  soft-tissue]
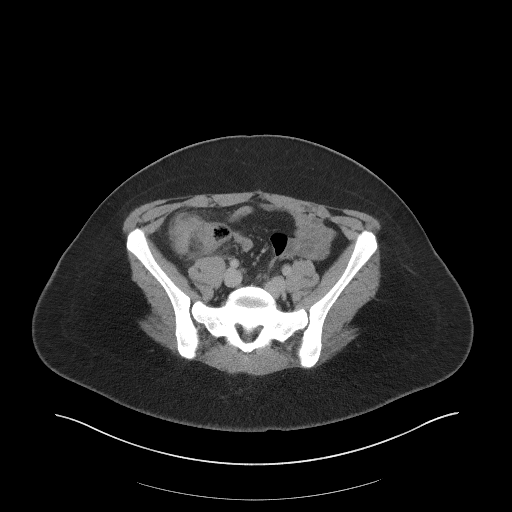
[im 49/101  soft-tissue]
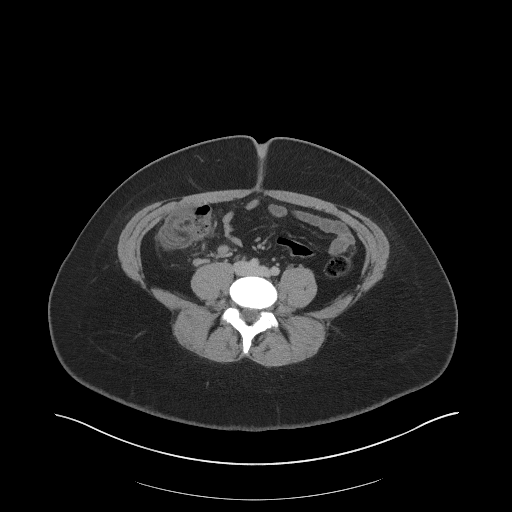
[im 57/101  soft-tissue]
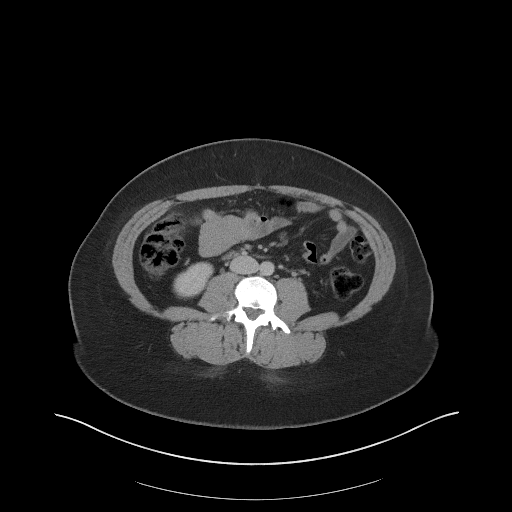
[im 65/101  soft-tissue]
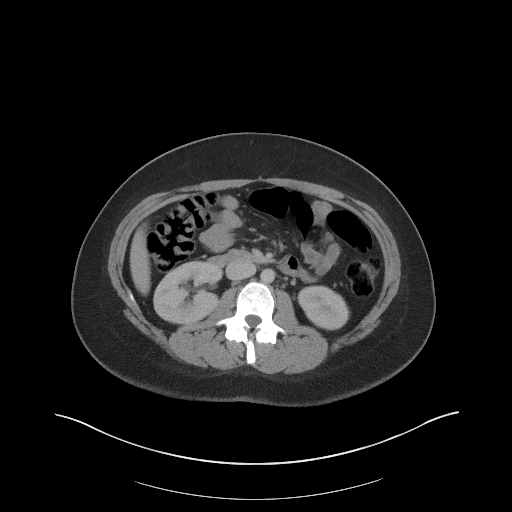
[im 73/101  soft-tissue]
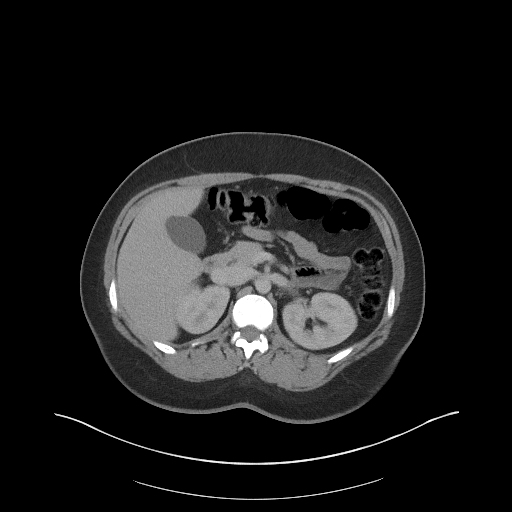
[im 73/101  bone]
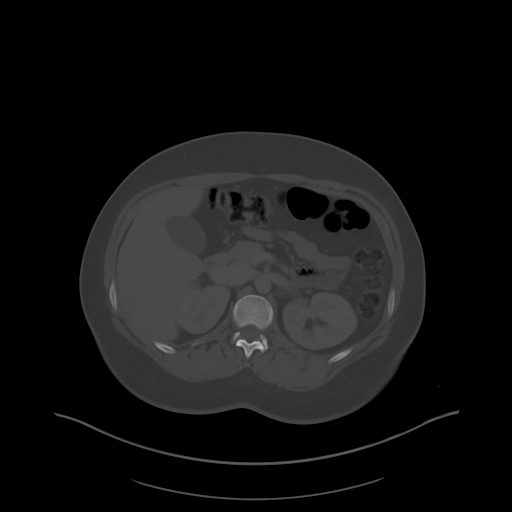
[im 81/101  soft-tissue]
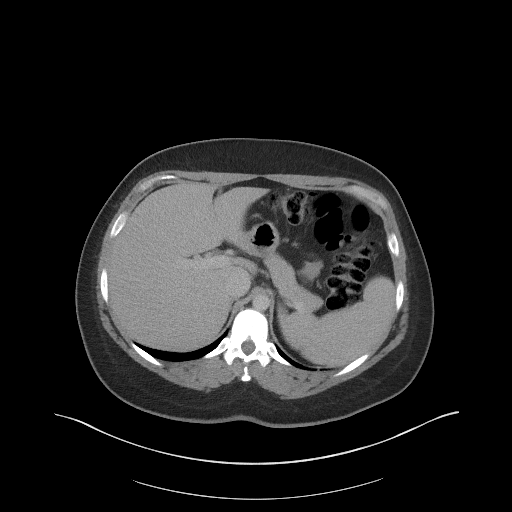
[im 89/101  soft-tissue]
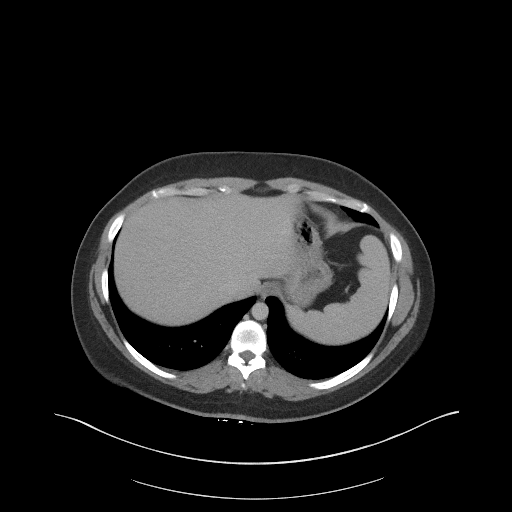
[im 97/101  soft-tissue]
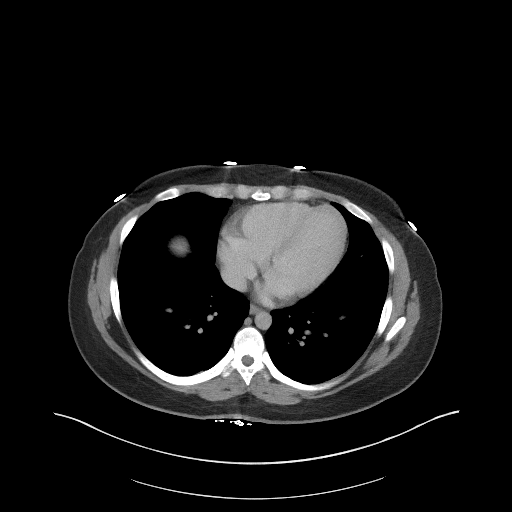

[Series 5: coronal st · coronal · 0.68mm/px · 3 of 91 slices shown]
[im 31/91  soft-tissue]
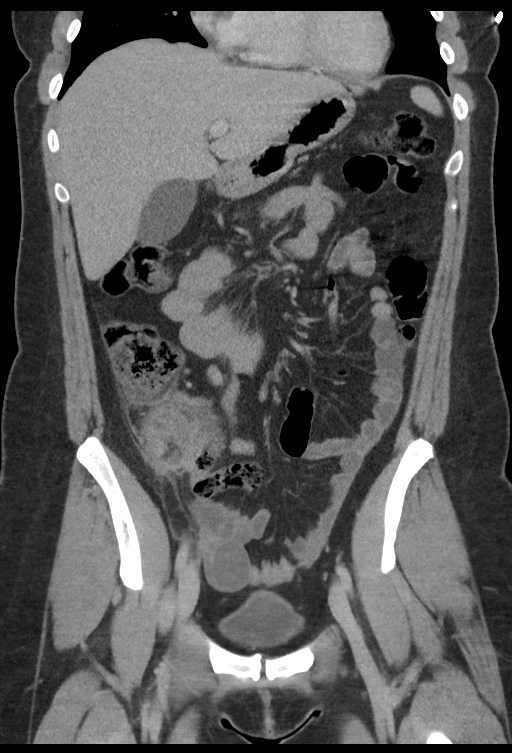
[im 41/91  soft-tissue]
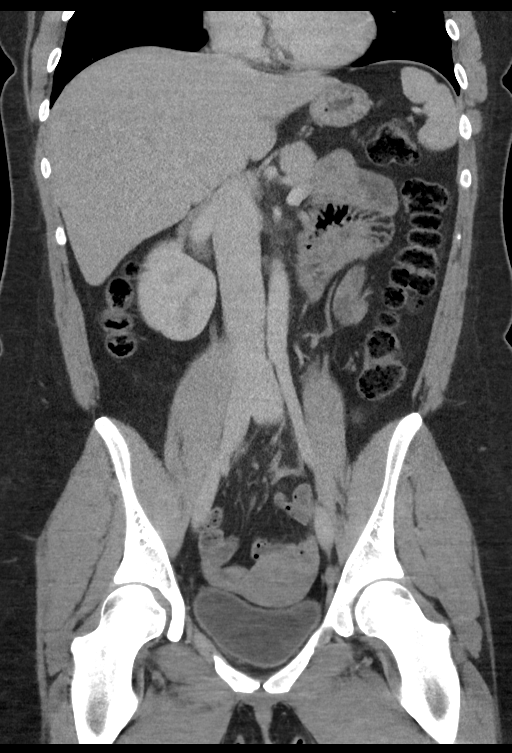
[im 51/91  soft-tissue]
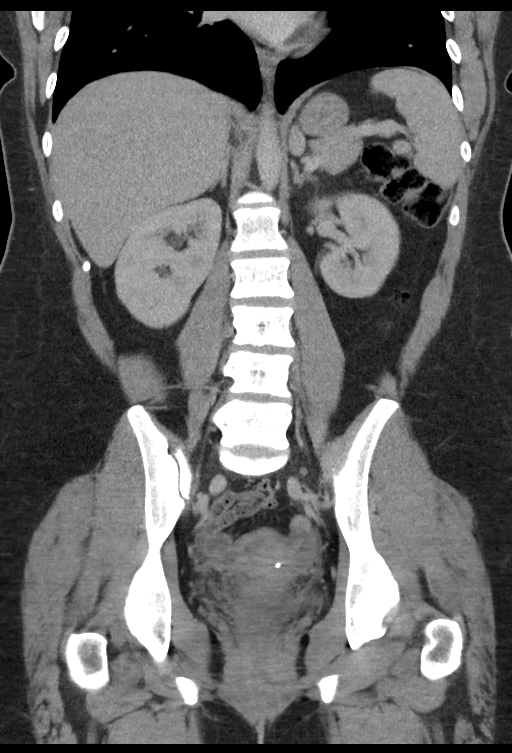

[15 of 46 positions shown; findings below may reference images not displayed]

FINDINGS: LOWER CHEST: Lung bases are clear. Included heart size is normal. No
pericardial effusion.

HEPATOBILIARY: Liver and gallbladder are normal.

PANCREAS: Normal.

SPLEEN: Normal.

ADRENALS/URINARY TRACT: Kidneys are orthotopic, demonstrating
symmetric enhancement. No nephrolithiasis, hydronephrosis or solid
renal masses. The unopacified ureters are normal in course and
caliber. Urinary bladder is partially distended and unremarkable.
Normal adrenal glands.

STOMACH/BOWEL: The stomach, small and large bowel are normal in
course and caliber without inflammatory changes.

Appendix: Location: RIGHT lower quadrant.

Diameter: 15 mm.

Appendicolith: Not present peer

Mucosal hyper-enhancement: Not present

Extraluminal gas: Not present

Periappendiceal collection: Marked periappendiceal fat stranding
without focal fluid collection.

VASCULAR/LYMPHATIC: Aortoiliac vessels are normal in course and
caliber. Greater than expected number of mildly enlarged, presumed
reactive RIGHT lower quadrant mesenteric lymph nodes.

REPRODUCTIVE: IUD displacement with apparent RIGHT myometrial
embedment.

OTHER: No intraperitoneal free fluid or free air.

MUSCULOSKELETAL: Nonacute.
IMPRESSION: 1. Acute appendicitis, possibly perforated without abscess.
2. Suspected IUD migration, recommend non emergent pelvic ultrasound
and gynecologic consultation.
3. Acute findings discussed with and reconfirmed by Dr.DURDANE

## 2019-07-20 ENCOUNTER — Telehealth: Payer: Self-pay | Admitting: Family Medicine

## 2019-07-20 NOTE — Telephone Encounter (Signed)
This RN returned pt's call at phone # provided. Pt reports that she believes she is due to have her Mirena removed and replaced. Per centricity records pt had Mirena placed 08/31/2014, so counseled pt that this July will be 5 years so we could do a removal and reinsertion. Counseled pt since it has been a while since she's been seen we could schedule a physical as well and pt states that would be good. Pt reports that she is not having any issues with Mirena and is satisfied with it has her BCM. Pt reports she would like to come in 09/02/2019. Attempted to schedule pt for that date, but schedule is not open yet. Counseled pt that schedule is not open yet that far in advance, so to give [place] a call back in a couple of weeks to schedule her appt and pt states understanding and she will give [place] a call back. Pt with no other questions or concerns at this time.Lyman Speller, RN

## 2019-07-20 NOTE — Telephone Encounter (Signed)
Patient would like to know when should she get Mirena removed and get a reinsertion.

## 2019-09-02 ENCOUNTER — Other Ambulatory Visit: Payer: Self-pay

## 2019-09-02 ENCOUNTER — Ambulatory Visit (LOCAL_COMMUNITY_HEALTH_CENTER): Payer: BC Managed Care – PPO | Admitting: Physician Assistant

## 2019-09-02 ENCOUNTER — Ambulatory Visit: Payer: BC Managed Care – PPO

## 2019-09-02 ENCOUNTER — Encounter: Payer: Self-pay | Admitting: Physician Assistant

## 2019-09-02 VITALS — BP 135/84 | Ht 66.0 in | Wt 263.0 lb

## 2019-09-02 DIAGNOSIS — Z30431 Encounter for routine checking of intrauterine contraceptive device: Secondary | ICD-10-CM

## 2019-09-02 DIAGNOSIS — Z0000 Encounter for general adult medical examination without abnormal findings: Secondary | ICD-10-CM | POA: Diagnosis not present

## 2019-09-02 DIAGNOSIS — Z3009 Encounter for other general counseling and advice on contraception: Secondary | ICD-10-CM

## 2019-09-02 NOTE — Progress Notes (Signed)
Pt to clinic for physical and IUD removal/reinsertion. Pt states she is not having any problems with Mirena IUD; pt desires to have IUD and insertion done on the same day.

## 2019-09-02 NOTE — Progress Notes (Signed)
ROI reviewed and signed for last PAP result and physical exam records for 2021, faxed to [facility]. Provider orders completed.

## 2019-09-04 ENCOUNTER — Encounter: Payer: Self-pay | Admitting: Physician Assistant

## 2019-09-04 NOTE — Progress Notes (Signed)
Family Planning Visit- Initial Visit  Subjective:  Taylor Wang is a 24 y.o.  [id number]   being seen today for an initial well woman visit and to discuss family planning options.  She is currently using *Mirena for pregnancy prevention. Patient reports she does not want a pregnancy in the next year.  Patient has the following medical conditions has Migraine without aura and without status migrainosus, not intractable; Tension headache; Chronic daily headache; and Appendicitis with perforation on their problem list.  Chief Complaint  Patient presents with  . Contraception    PE    Patient reports that she is due to have her IUD removed and wants removal/reinsertioin at same time.  Reports that she has depression and anxiety that she is followed by her PCP/therapist for and is doing well with her medicine.  States that she had PE and pap in February of this year.  States that she has migraines that are usually related to stress.  Reports concern about weight gain.    Patient denies any changes to personal or family history or other concerns today.    Body mass index is 42.45 kg/m. - Patient is eligible for diabetes screening based on BMI and age >59?  not applicable HA1C ordered? not applicable  Patient reports 1 of partners in last year. Desires STI screening?  No - declines.  Has patient been screened once for HCV in the past?  No  No results found for: HCVAB  Does the patient have current of drug use, have a partner with drug use, and/or has been incarcerated since last result? No  If yes-- Screen for HCV through [facility]   Does the patient meet criteria for HBV testing? No  Criteria:  -Household, sexual or needle sharing contact with HBV -History of drug use -HIV positive -Those with known Hep C   Health Maintenance Due  Topic Date Due  . Hepatitis C Screening  Never done  . COVID-19 Vaccine (1) Never done  . TETANUS/TDAP  Never done  . PAP-Cervical Cytology  Screening  Never done  . PAP SMEAR-Modifier  Never done    Review of Systems  All other systems reviewed and are negative.   The following portions of the patient's history were reviewed and updated as appropriate: allergies, current medications, past family history, past medical history, past social history, past surgical history and problem list. Problem list updated.   See flowsheet for other program required questions.  Objective:   Vitals:   09/02/19 1521  BP: 135/84  Weight: 263 lb (119.3 kg)  Height: 5\' 6"  (1.676 m)    Physical Exam Vitals and nursing note reviewed.  Constitutional:      General: She is not in acute distress.    Appearance: Normal appearance.  HENT:     Head: Normocephalic and atraumatic.  Eyes:     Conjunctiva/sclera: Conjunctivae normal.  Neck:     Thyroid: No thyroid mass, thyromegaly or thyroid tenderness.  Cardiovascular:     Rate and Rhythm: Normal rate and regular rhythm.  Pulmonary:     Effort: Pulmonary effort is normal.     Breath sounds: Normal breath sounds.  Abdominal:     Palpations: Abdomen is soft. There is no mass.     Tenderness: There is no abdominal tenderness. There is no guarding or rebound.  Musculoskeletal:     Cervical back: Neck supple. No tenderness.  Lymphadenopathy:     Cervical: No cervical adenopathy.  Skin:  General: Skin is warm and dry.     Findings: No bruising, erythema, lesion or rash.  Neurological:     Mental Status: She is alert and oriented to person, place, and time.  Psychiatric:        Mood and Affect: Mood normal.        Behavior: Behavior normal.        Thought Content: Thought content normal.        Judgment: Judgment normal.       Assessment and Plan:  Taylor Wang is a 24 y.o. female presenting to the [facility] Department for an initial well woman exam/family planning visit  Contraception counseling: Reviewed all forms of birth control options in the tiered based  approach. available including abstinence; over the counter/barrier methods; hormonal contraceptive medication including pill, patch, ring, injection,contraceptive implant, ECP; hormonal and nonhormonal IUDs; permanent sterilization options including vasectomy and the various tubal sterilization modalities. Risks, benefits, and typical effectiveness rates were reviewed.  Questions were answered.  Written information was also given to the patient to review.  Patient desires Mirena removal/reinsertion despite counseling that Mirena will protect her for another 2 years. She will follow up in  3 days for removal/reinsertion appointment .  She was told to call with any further questions, or with any concerns about this method of contraception.  Emphasized use of condoms 100% of the time for STI prevention.  Patient was not a candidate for ECP today.  1. Encounter for counseling regarding contraception Counseled that Mirena can protect from pregnancy for up to 7 years but patient would like removal/reinsertion soon. Rec condoms with all sex in interim and for 7 days after reinsertion of new IUD.  2. Well woman exam (no gynecological exam) Reviewed health habits to help with weight loss and healthy weight. Enc MVI 1 po daily. Enc patient to continue follow up with PCP/therapist per recommendations for anxiety and depression, illness and other primary care concerns.  Please send ROI to previous provider for last PE and pap from 03/2019.  3. Surveillance of intrauterine contraceptive device Please schedule for Mirena removal/reinsertion per patient request.     Return in 3 days (on 09/05/2019) for IUD procedure.  Future Appointments  Date Time Provider Department Center  09/05/2019 10:40 AM [facility] PROVIDER [facility] None    Matt Holmes, PA

## 2019-09-05 ENCOUNTER — Encounter: Payer: Self-pay | Admitting: Family Medicine

## 2019-09-05 ENCOUNTER — Ambulatory Visit (LOCAL_COMMUNITY_HEALTH_CENTER): Payer: BC Managed Care – PPO | Admitting: Family Medicine

## 2019-09-05 ENCOUNTER — Other Ambulatory Visit: Payer: Self-pay

## 2019-09-05 VITALS — BP 130/73 | Ht 66.0 in | Wt 263.8 lb

## 2019-09-05 DIAGNOSIS — Z30432 Encounter for removal of intrauterine contraceptive device: Secondary | ICD-10-CM

## 2019-09-05 DIAGNOSIS — Z3009 Encounter for other general counseling and advice on contraception: Secondary | ICD-10-CM

## 2019-09-05 DIAGNOSIS — Z30433 Encounter for removal and reinsertion of intrauterine contraceptive device: Secondary | ICD-10-CM | POA: Diagnosis not present

## 2019-09-05 DIAGNOSIS — Z30430 Encounter for insertion of intrauterine contraceptive device: Secondary | ICD-10-CM

## 2019-09-05 DIAGNOSIS — Z113 Encounter for screening for infections with a predominantly sexual mode of transmission: Secondary | ICD-10-CM

## 2019-09-05 MED ORDER — LEVONORGESTREL 20 MCG/24HR IU IUD: INTRAUTERINE_SYSTEM | Freq: Once | INTRAUTERINE | Status: AC

## 2019-09-05 NOTE — Progress Notes (Addendum)
Here today for IUD removal/reinsertion. Last PE here 09/02/2019. Unsure of last GC/Chlamydia lab. Last Pap Smear per patient was 03/2019 at [facility]. ROI faxed for above. Tawny Hopping, RN

## 2019-09-05 NOTE — Progress Notes (Signed)
[facility]   S:  here for IUD. Discussed BCM options. Reviewed routine STI screening and patient accepts CT/GC today.   O: BP 130/73   Wt 263 lb 12.8 oz (119.7 kg)   LMP 08/27/2019 (Exact Date)   BMI 42.58 kg/m   Gen: well appearing, NAD HEENT: no scleral icterus CV: RR Lung: Normal WOB Ext: warm well perfused  Assessment and Plan:  IUD Removal  Patient identified, informed consent performed, consent signed.  Patient was in the dorsal lithotomy position, normal external genitalia was noted.  A speculum was placed in the patient's vagina, normal discharge was noted, no lesions. The cervix was visualized, no lesions, no abnormal discharge.  The strings of the IUD were grasped and pulled using ring forceps. The IUD was removed in its entirety. .  Patient tolerated the procedure well.     UPT not indicated given removal/reinsertation today. GC/CT collected today See Flowsheet for IUD check list  IUD Insertion Procedure Note Patient identified, informed consent performed, consent signed.   Discussed risks of irregular bleeding, cramping, infection, malpositioning or misplacement of the IUD outside the uterus which may require further procedure such as laparoscopy. Time out was performed.    Speculum placed in the vagina.  Cervix visualized.  Cleaned with Betadine x 2.  Grasped anteriorly with a single tooth tenaculum.  Uterus sounded to 8 cm.  IUD placed per manufacturer's recommendations.  Strings trimmed to 3 cm. Tenaculum was removed, good hemostasis noted.  Patient tolerated procedure well.   Patient was given post-procedure instructions- both agency handout and verbally by provider.  She was advised to have backup contraception for one week.  Patient was also asked to check IUD strings periodically or follow up in 4 weeks for IUD check.

## 2019-09-08 ENCOUNTER — Encounter: Payer: Self-pay | Admitting: Physician Assistant

## 2019-09-08 NOTE — Progress Notes (Signed)
Patient seen on 09/02/2019 and ROI signed for [facility] for copy of last PE and pap.  Per reply from [facility], patient has not been a patient there.    Per review of [facility] paperwork, patient had the PE and pap at [facility], not [facility]. Flag added to FYI to have ROI signed for [facility] at next visit.

## 2019-09-13 ENCOUNTER — Telehealth: Payer: Self-pay | Admitting: Family Medicine

## 2019-09-13 NOTE — Telephone Encounter (Signed)
Call to patient to discuss results of CH/GC.  No answer, LMOM for patient to return call. Wendi Snipes, RN

## 2019-09-16 ENCOUNTER — Encounter: Payer: Self-pay | Admitting: Advanced Practice Midwife

## 2019-09-16 ENCOUNTER — Other Ambulatory Visit: Payer: Self-pay

## 2019-09-16 ENCOUNTER — Ambulatory Visit: Payer: Self-pay | Admitting: Advanced Practice Midwife

## 2019-09-16 DIAGNOSIS — F325 Major depressive disorder, single episode, in full remission: Secondary | ICD-10-CM

## 2019-09-16 DIAGNOSIS — Z113 Encounter for screening for infections with a predominantly sexual mode of transmission: Secondary | ICD-10-CM

## 2019-09-16 LAB — WET PREP FOR TRICH, YEAST, CLUE
Trichomonas Exam: NEGATIVE
Yeast Exam: NEGATIVE

## 2019-09-16 NOTE — Progress Notes (Signed)
[facility] Department STI clinic/screening visit  Subjective:  Taylor Wang is a 25 y.o. SWF nullip nonsmoker female being seen today for an STI screening visit. The patient reports they do not have symptoms.  Patient reports that they do not desire a pregnancy in the next year.   They reported they are not interested in discussing contraception today.  Patient's last menstrual period was 09/16/2019 (exact date).   Patient has the following medical conditions:   Patient Active Problem List   Diagnosis Date Noted  . Appendicitis with perforation 12/15/2017  . Migraine without aura and without status migrainosus, not intractable 03/17/2013  . Tension headache 03/17/2013  . Chronic daily headache 03/17/2013    No chief complaint on file.   HPI  Patient reports was here 09/05/19 for Mirena removal/reinsertion and back today because Chlamydia insufficient.  Last sex 09/12/19 without condom; with current partner x 1.5 years.  LMP 09/13/19.  Last pap 04/01/2019 neg at [facility] clinic along with PE (not including CBE)    Last HIV test per patient/review of record was 12/16/17. Patient reports last pap was 03/2019 neg  See flowsheet for further details and programmatic requirements.    The following portions of the patient's history were reviewed and updated as appropriate: allergies, current medications, past medical history, past social history, past surgical history and problem list.  Objective:  There were no vitals filed for this visit.  Physical Exam Vitals and nursing note reviewed.  Constitutional:      Appearance: Normal appearance. She is obese.  HENT:     Head: Normocephalic and atraumatic.     Mouth/Throat:     Mouth: Mucous membranes are moist.     Pharynx: Oropharynx is clear. No oropharyngeal exudate or posterior oropharyngeal erythema.  Eyes:     Conjunctiva/sclera: Conjunctivae normal.  Pulmonary:     Effort: Pulmonary effort is normal.  Abdominal:      Palpations: Abdomen is soft. There is no mass.     Tenderness: There is no abdominal tenderness. There is no rebound.     Comments: Poor tone, soft without tenderness, increased adipose  Genitourinary:    General: Normal vulva.     Exam position: Lithotomy position.     Pubic Area: No rash or pubic lice.      Labia:        Right: No rash or lesion.        Left: No rash or lesion.      Vagina: Bleeding (moderate amt red menses, ph>4.5) present. No vaginal discharge, erythema or lesions.     Cervix: Normal.     Uterus: Normal.      Adnexa: Right adnexa normal and left adnexa normal.     Rectum: Normal.  Lymphadenopathy:     Head:     Right side of head: No preauricular or posterior auricular adenopathy.     Left side of head: No preauricular or posterior auricular adenopathy.     Cervical: No cervical adenopathy.     Upper Body:     Right upper body: No supraclavicular or axillary adenopathy.     Left upper body: No supraclavicular or axillary adenopathy.     Lower Body: No right inguinal adenopathy. No left inguinal adenopathy.  Skin:    General: Skin is warm and dry.     Findings: No rash.  Neurological:     Mental Status: She is alert and oriented to person, place, and time.  Assessment and Plan:  Taylor Wang is a 24 y.o. female presenting to the [facility] Department for STI screening  1. Screening examination for venereal disease Please get ROI for pap/PE at [facility] clinic Treat wet mount per standing orders Immunization nurse consult - Chlamydia/Gonorrhea [place] [facility] - Syphilis Serology, [place] [facility] - HIV [place] [facility] - WET PREP FOR TRICH, YEAST, CLUE     No follow-ups on file.  No future appointments.  Alberteen Spindle, CNM

## 2019-09-16 NOTE — Progress Notes (Signed)
Wet mount reviewed, no treatment indicated. ROI for last paps and PE faxed to [facility].Marland KitchenBurt Knack, RN

## 2019-09-21 LAB — GONOCOCCUS CULTURE

## 2019-10-01 ENCOUNTER — Encounter: Payer: Self-pay | Admitting: Physician Assistant

## 2019-10-01 LAB — HM PAP SMEAR

## 2020-07-04 ENCOUNTER — Ambulatory Visit (LOCAL_COMMUNITY_HEALTH_CENTER): Payer: BC Managed Care – PPO

## 2020-07-04 ENCOUNTER — Other Ambulatory Visit: Payer: Self-pay

## 2020-07-04 DIAGNOSIS — Z7185 Encounter for immunization safety counseling: Secondary | ICD-10-CM

## 2020-07-04 NOTE — Progress Notes (Signed)
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 [employer] review shows 2 Hep B (one Twinrix 2021 and one Hep B vaccine (single antigen Hep B) 2019. [employer] is limited with childhood vaccines.Discussed need to get childhood vaccine record to assess other Hep B vaccines. Pt contacted employer who has only [employer] record. Advised pt to contact [employer] to obtain school vaccine record. Pt graduated from [employer], [place]. Pt in agreement and plans to f-u with school. Questions answered and reports understanding. Jerel Shepherd, RN

## 2020-12-06 ENCOUNTER — Ambulatory Visit: Payer: BC Managed Care – PPO

## 2020-12-06 ENCOUNTER — Telehealth: Payer: Self-pay | Admitting: Family Medicine

## 2020-12-06 ENCOUNTER — Ambulatory Visit: Payer: Self-pay

## 2020-12-06 NOTE — Telephone Encounter (Signed)
My appointment was rescheduled for Monday. I am in discomfort and need to be seen sooner.

## 2020-12-06 NOTE — Telephone Encounter (Signed)
Pt called in stating she is having low pelvic pain that has been going on since Tuesday. She reports the pain is consistent and only goes away when she takes Advil. She also reports that she has sharp pain after urinating. Pain level is 7/10. She has an IUD and has it replaced already. She stated she had an appt with local HD but they called her this morning and canceled the appt d/t office issues and didn't have any other appts until Monday. Advised pt since she doesn't have PCP, she needed to be seen at [facility]. Pt stated that she would go there today. Care advice given and pt verbalized understanding.   Reason for Disposition  [1] MILD-MODERATE pain AND [2] constant AND [3] present > 2 hours  Answer Assessment - Initial Assessment Questions 1. LOCATION: "Where does it hurt?"      Low pelvic pain, both sides 2. RADIATION: "Does the pain shoot anywhere else?" (e.g., lower back, groin, thighs)     Radiates down to vagina area 3. ONSET: "When did the pain begin?" (e.g., minutes, hours or days ago)      Tuesday morning 4. SUDDEN: "Gradual or sudden onset?"     suddenly 5. PATTERN "Does the pain come and go, or is it constant?"    - If constant: "Is it getting better, staying the same, or worsening?"      (Note: Constant means the pain never goes away completely; most serious pain is constant and gets worse over time)     - If intermittent: "How long does it last?" "Do you have pain now?"     (Note: Intermittent means the pain goes away completely between bouts)     Come and go only when taking advil 6. SEVERITY: "How bad is the pain?"  (e.g., Scale 1-10; mild, moderate, or severe)   - MILD (1-3): doesn't interfere with normal activities, area soft and not tender to touch    - MODERATE (4-7): interferes with normal activities or awakens from sleep, abdomen tender to touch    - SEVERE (8-10): excruciating pain, doubled over, unable to do any normal activities      7 7. RECURRENT SYMPTOM: "Have you  ever had this type of pelvic pain before?" If Yes, ask: "When was the last time?" and "What happened that time?"      no 8. CAUSE: "What do you think is causing the pelvic pain?"     unsure 9. RELIEVING/AGGRAVATING FACTORS: "What makes it better or worse?" (e.g., activity/rest, sexual intercourse, voiding, passing stool)     Advil, hot bath helps but pain comes back 10. OTHER SYMPTOMS: "Has there been any other symptoms?" (e.g., fever, constipation, diarrhea, urine problems, vaginal bleeding, vaginal discharge, or vomiting?"       Sharp pain after urinating,  11. PREGNANCY: "Is there any chance you are pregnant?" "When was your last menstrual period?"       No, IUD 2nd round  Protocols used: Pelvic Pain - [facility]

## 2020-12-06 NOTE — Telephone Encounter (Signed)
Client's [facility] appt rescheduled to 12/10/2020 due to staffing concerns. Call to client who is requesting earlier appt than 12/10/2020 due to pain, pressure, IUD concerns and concerns may have UTI. Counseled no available appts at [facility] until 12/10/2020 and that we did not do evaluation for UTI. Per Epic, client [facility] this am who encouraged her to seek evaluation at urgent care center since [facility] appt not until 12/10/2020. Per client, called [facility] walk-in clinic and was told they did not have anyone there who could deal with her IUD. As has 01/16/2021 [facility] appt, this writer encouraged her to call them for earlier appt. Per client, they can't see her until scheduled appt. Client encouraged to seek evaluation at another urgent care center / ED as having pain (7/10) / pressure, vaginal discomfort (symptoms as documented by RN at [facility]). Jossie Ng, RN

## 2020-12-07 ENCOUNTER — Ambulatory Visit: Payer: BC Managed Care – PPO

## 2020-12-07 ENCOUNTER — Other Ambulatory Visit: Payer: Self-pay

## 2020-12-07 ENCOUNTER — Encounter: Payer: Self-pay | Admitting: Emergency Medicine

## 2020-12-07 ENCOUNTER — Ambulatory Visit
Admission: EM | Admit: 2020-12-07 | Discharge: 2020-12-07 | Disposition: A | Discharge status: Home / Self Care | Payer: BC Managed Care – PPO | Attending: Emergency Medicine | Admitting: Emergency Medicine

## 2020-12-07 DIAGNOSIS — R300 Dysuria: Secondary | ICD-10-CM | POA: Insufficient documentation

## 2020-12-07 LAB — PREGNANCY, URINE: Preg Test, Ur: NEGATIVE

## 2020-12-07 LAB — URINALYSIS, COMPLETE (UACMP) WITH MICROSCOPIC
Bilirubin Urine: NEGATIVE
Glucose, UA: NEGATIVE mg/dL
Hgb urine dipstick: NEGATIVE
Ketones, ur: NEGATIVE mg/dL
Nitrite: NEGATIVE
Protein, ur: NEGATIVE mg/dL
Specific Gravity, Urine: 1.02 (ref 1.005–1.030)
pH: 7 (ref 5.0–8.0)

## 2020-12-07 MED ORDER — CEPHALEXIN 500 MG PO CAPS: 500.0000 mg | ORAL_CAPSULE | Freq: Four times a day (QID) | ORAL | 0 refills | Status: AC

## 2020-12-07 NOTE — ED Triage Notes (Signed)
Patient reports lower abdominal pain that started on Tuesday.  Patient states that she has had an IUD in place for over a year now.  Patient denies vaginal discharge or bleeding.  Patient denies N/V/D.  Patient reports some discomfort after she urinates.

## 2020-12-07 NOTE — Discharge Instructions (Addendum)
Take Keflex 4 times daily for the next 7 days. If pain worsens, please go to the emergency department at [facility] for further care and management.

## 2020-12-07 NOTE — ED Provider Notes (Signed)
[facility]  ____________________________________________  Time seen: Approximately 2:28 PM  I have reviewed the triage vital signs and the nursing notes.   HISTORY  Chief Complaint Abdominal Pain   Historian Patient     HPI Taylor Wang is a 25 y.o. female presents to the urgent care with suprapubic pain and pain when patient stops urinating.  She is also had some low back pain.  She denies increased urinary frequency or hematuria.  She states that she has had some nausea but no vomiting.  She has been afebrile.  No changes in vaginal discharge or concern for STDs.   Past Medical History:  Diagnosis Date   Depression    Headache(784.0)      Immunizations up to date:  Yes.     Past Medical History:  Diagnosis Date   Depression    Headache(784.0)     Patient Active Problem List   Diagnosis Date Noted   Morbid obesity (HCC) 263 lb 09/16/2019   Depression, major, dx'd 03/2019 09/16/2019   Appendicitis with perforation 12/15/2017   Migraine without aura and without status migrainosus, not intractable 03/17/2013   Tension headache 03/17/2013   Chronic daily headache 03/17/2013    Past Surgical History:  Procedure Laterality Date   ADENOIDECTOMY, TONSILLECTOMY AND MYRINGOTOMY WITH TUBE PLACEMENT Bilateral 05/2002   APPENDECTOMY  2019   TYMPANOSTOMY TUBE PLACEMENT Bilateral 06/2006    Prior to Admission medications   Medication Sig Start Date End Date Taking? Authorizing Provider  cephALEXin (KEFLEX) 500 MG capsule Take 1 capsule (500 mg total) by mouth 4 (four) times daily for 7 days. 12/07/20 12/14/20 Yes Orvil Feil, PA-C  levonorgestrel (MIRENA) 20 MCG/24HR IUD 1 each by Intrauterine route once. 08/31/14  Yes [provider]  sertraline (ZOLOFT) 25 MG tablet Take 25 mg by mouth daily.   Yes [provider]  cetirizine (ZYRTEC) 10 MG tablet Take 10 mg by mouth daily as needed for allergies. Patient not taking: No sig  reported    [provider]  docusate sodium (COLACE) 100 MG capsule Take 1 capsule (100 mg total) by mouth 2 (two) times daily as needed for mild constipation. Patient not taking: No sig reported 12/19/17   Tonna Boehringer, Isami, DO  fluticasone (FLONASE) 50 MCG/ACT nasal spray Place 2 sprays into both nostrils daily as needed for allergies or rhinitis. Patient not taking: No sig reported    [provider]  HYDROcodone-acetaminophen (NORCO/VICODIN) 5-325 MG tablet Take 1-2 tablets by mouth every 4 (four) hours as needed for moderate pain. Patient not taking: No sig reported 12/19/17   Sung Amabile, DO  ibuprofen (ADVIL,MOTRIN) 800 MG tablet Take 800 mg by mouth every 8 (eight) hours as needed.    [provider]  Magnesium Oxide 500 MG TABS Take by mouth. Patient not taking: No sig reported    [provider]  riboflavin (VITAMIN B-2) 100 MG TABS tablet Take 100 mg by mouth daily. Patient not taking: No sig reported    [provider]    Allergies Other, Penicillins, and Sulfa antibiotics  Family History  Problem Relation Age of Onset   Hypertension Mother    ADD / ADHD Brother    Cancer Maternal Grandmother    Migraines Maternal Aunt     Social History Social History   Tobacco Use   Smoking status: Never   Smokeless tobacco: Never  Vaping Use   Vaping Use: Never used  Substance Use Topics   Alcohol use:  Never   Drug use: Never     Review of Systems  Constitutional: No fever/chills Eyes:  No discharge ENT: No upper respiratory complaints. Respiratory: no cough. No SOB/ use of accessory muscles to breath Gastrointestinal: Patient has suprapubic pain.  Musculoskeletal: Negative for musculoskeletal pain. Skin: Negative for rash, abrasions, lacerations, ecchymosis.    ____________________________________________   PHYSICAL EXAM:  VITAL SIGNS: ED Triage Vitals  Enc Vitals Group     BP 12/07/20 1406 124/76     Pulse Rate  12/07/20 1406 83     Resp 12/07/20 1406 14     Temp 12/07/20 1406 98.5 F (36.9 C)     Temp Source 12/07/20 1406 Oral     SpO2 12/07/20 1406 99 %     Weight 12/07/20 1402 263 lb 14.3 oz (119.7 kg)     Height 12/07/20 1402 5\' 6"  (1.676 m)     Head Circumference --      Peak Flow --      Pain Score 12/07/20 1402 9     Pain Loc --      Pain Edu? --      Excl. in GC? --      Constitutional: Alert and oriented. Well appearing and in no acute distress. Eyes: Conjunctivae are normal. PERRL. EOMI. Head: Atraumatic. ENT:      Nose: No congestion/rhinnorhea.      Mouth/Throat: Mucous membranes are moist.  Neck: No stridor.  No cervical spine tenderness to palpation. Cardiovascular: Normal rate, regular rhythm. Normal S1 and S2.  Good peripheral circulation. Respiratory: Normal respiratory effort without tachypnea or retractions. Lungs CTAB. Good air entry to the bases with no decreased or absent breath sounds Gastrointestinal: Bowel sounds x 4 quadrants. Patient has suprapubic tenderness to palpation.  No guarding or rigidity. No distention. Musculoskeletal: Full range of motion to all extremities. No obvious deformities noted Neurologic:  Normal for age. No gross focal neurologic deficits are appreciated.  Skin:  Skin is warm, dry and intact. No rash noted. Psychiatric: Mood and affect are normal for age. Speech and behavior are normal.   ____________________________________________   LABS (all labs ordered are listed, but only abnormal results are displayed)  Labs Reviewed  URINALYSIS, COMPLETE (UACMP) WITH MICROSCOPIC - Abnormal; Notable for the following components:      Result Value   Leukocytes,Ua SMALL (*)    Bacteria, UA FEW (*)    All other components within normal limits  URINE CULTURE  PREGNANCY, URINE   ____________________________________________  EKG   ____________________________________________  RADIOLOGY 12/09/20, personally viewed and evaluated  these images (plain radiographs) as part of my medical decision making, as well as reviewing the written report by the radiologist.    No results found.  ____________________________________________    PROCEDURES  Procedure(s) performed:     Procedures     Medications - No data to display   ____________________________________________   INITIAL IMPRESSION / ASSESSMENT AND PLAN / ED COURSE  Pertinent labs & imaging results that were available during my care of the patient were reviewed by me and considered in my medical decision making (see chart for details).      Assessment and Plan:  Dysuria 25 year old female presents to the urgent care with dysuria that started on Tuesday and some suprapubic pain as well as some low back pain and nausea.  Patient performed a clean-catch urinalysis in the urgent care which showed rare bacteria and leukocytes.  Urine culture is pending.  We will  treat with Keflex empirically 4 times daily for the next 7 days.  Cautioned patient that if she should experience worsening pelvic pain or abdominal pain, vomiting or fever, she should seek care at local emergency department.  All patient questions were answered.    ____________________________________________  FINAL CLINICAL IMPRESSION(S) / ED DIAGNOSES  Final diagnoses:  Dysuria      NEW MEDICATIONS STARTED DURING THIS VISIT:  ED Discharge Orders          Ordered    cephALEXin (KEFLEX) 500 MG capsule  4 times daily        12/07/20 1505                This chart was dictated using voice recognition software/Dragon. Despite best efforts to proofread, errors can occur which can change the meaning. Any change was purely unintentional.     Orvil Feil, PA-C 12/07/20 1509

## 2020-12-10 ENCOUNTER — Ambulatory Visit: Payer: BC Managed Care – PPO

## 2020-12-10 LAB — URINE CULTURE

## 2021-01-01 ENCOUNTER — Other Ambulatory Visit: Payer: Self-pay

## 2021-01-01 ENCOUNTER — Ambulatory Visit (INDEPENDENT_AMBULATORY_CARE_PROVIDER_SITE_OTHER): Payer: BC Managed Care – PPO | Admitting: Obstetrics and Gynecology

## 2021-01-01 ENCOUNTER — Encounter: Payer: Self-pay | Admitting: Obstetrics and Gynecology

## 2021-01-01 VITALS — BP 122/82 | HR 80 | Resp 16 | Ht 64.0 in | Wt 279.7 lb

## 2021-01-01 DIAGNOSIS — R300 Dysuria: Secondary | ICD-10-CM | POA: Diagnosis not present

## 2021-01-01 LAB — POCT URINALYSIS DIPSTICK
Bilirubin, UA: NEGATIVE
Glucose, UA: NEGATIVE
Ketones, UA: NEGATIVE
Nitrite, UA: NEGATIVE
Protein, UA: POSITIVE — AB
Spec Grav, UA: 1.015 (ref 1.010–1.025)
Urobilinogen, UA: 0.2 E.U./dL
pH, UA: 6 (ref 5.0–8.0)

## 2021-01-01 NOTE — Progress Notes (Addendum)
HPI:      Taylor Wang is a 25 y.o. G0P0000 who LMP was No LMP recorded. (Menstrual status: IUD).  Subjective:   She presents today with complaint of burning after urination.  She also has stabbing pelvic pain usually associated with urination.  She was previously treated recently for a UTI and she took off her medication (Keflex).  She states that she "got a little bit better" but not completely. She has a Mirena IUD in place and her IUD prior to this and was malpositioned.  She wondered whether this IUD was causing problems with pelvic pain.  Upon further questioning, patient is not having bleeding or menstrual periods with her IUD.  She also was unaware of her previously malpositioned IUD. She denies concerns regarding STDs.    Hx: The following portions of the patient's history were reviewed and updated as appropriate:             She  has a past medical history of Depression and Headache(784.0). She does not have any pertinent problems on file. She  has a past surgical history that includes Adenoidectomy, tonsillectomy and myringotomy with tube placement (Bilateral, 05/2002); Tympanostomy tube placement (Bilateral, 06/2006); and Appendectomy (2019). Her family history includes ADD / ADHD in her brother; Cancer in her maternal grandmother; Hypertension in her mother; Migraines in her maternal aunt. She  reports that she has never smoked. She has never used smokeless tobacco. She reports that she does not drink alcohol and does not use drugs. She has a current medication list which includes the following prescription(s): ibuprofen, levonorgestrel, and sertraline. She is allergic to other, penicillins, and sulfa antibiotics.       Review of Systems:  Review of Systems  Constitutional: Denied constitutional symptoms, night sweats, recent illness, fatigue, fever, insomnia and weight loss.  Eyes: Denied eye symptoms, eye pain, photophobia, vision change and visual disturbance.   Ears/Nose/Throat/Neck: Denied ear, nose, throat or neck symptoms, hearing loss, nasal discharge, sinus congestion and sore throat.  Cardiovascular: Denied cardiovascular symptoms, arrhythmia, chest pain/pressure, edema, exercise intolerance, orthopnea and palpitations.  Respiratory: Denied pulmonary symptoms, asthma, pleuritic pain, productive sputum, cough, dyspnea and wheezing.  Gastrointestinal: Denied, gastro-esophageal reflux, melena, nausea and vomiting.  Genitourinary: See HPI for additional information.  Musculoskeletal: Denied musculoskeletal symptoms, stiffness, swelling, muscle weakness and myalgia.  Dermatologic: Denied dermatology symptoms, rash and scar.  Neurologic: Denied neurology symptoms, dizziness, headache, neck pain and syncope.  Psychiatric: Denied psychiatric symptoms, anxiety and depression.  Endocrine: Denied endocrine symptoms including hot flashes and night sweats.   Meds:   Current Outpatient Medications on File Prior to Visit  Medication Sig Dispense Refill   ibuprofen (ADVIL,MOTRIN) 800 MG tablet Take 800 mg by mouth every 8 (eight) hours as needed.     levonorgestrel (MIRENA) 20 MCG/24HR IUD 1 each by Intrauterine route once.     sertraline (ZOLOFT) 25 MG tablet Take 25 mg by mouth daily.     No current facility-administered medications on file prior to visit.      Objective:     Vitals:   01/01/21 1343  BP: 122/82  Pulse: 80  Resp: 16   Filed Weights   01/01/21 1343  Weight: 279 lb 11.2 oz (126.9 kg)              Physical examination   Pelvic:   Vulva: Normal appearance.  No lesions.  Vagina: No lesions or abnormalities noted.  Support: Normal pelvic support.  Urethra No masses tenderness  or scarring.  Meatus Normal size without lesions or prolapse.  Cervix: Normal appearance.  No lesions.  IUD strings noted at cervical os  Anus: Normal exam.  No lesions.  Perineum: Normal exam.  No lesions.        Bimanual   Uterus: Normal size.   Non-tender.  Mobile.  AV.  Adnexae: No masses.  Non-tender to palpation.  Cul-de-sac: Negative for abnormality.   UA shows some hematuria but negative nitrates          Assessment:    G0P0000 Patient Active Problem List   Diagnosis Date Noted   Morbid obesity (HCC) 263 lb 09/16/2019   Depression, major, dx'd 03/2019 09/16/2019   Appendicitis with perforation 12/15/2017   Migraine without aura and without status migrainosus, not intractable 03/17/2013   Tension headache 03/17/2013   Chronic daily headache 03/17/2013     1. Dysuria     Patient likely continues to have an incompletely treated UTI.  Her symptoms of dysuria immediately after urinating and pelvic pain are consistent with this diagnosis.  This especially in light of the fact that she recently had a UTI.  IUD strings appear to be positioned correctly and without bleeding I doubt it is a malpositioned IUD.   Plan:            1.  UA today we will treat if likely UTI.  Otherwise C&S sent.  Negative nitrates so will await C&S before treating if necessary.  2.  Encourage increase fluids and voiding.  3.  Discussed possibility of ultrasound for IUD position and patient declined at this time. Orders No orders of the defined types were placed in this encounter.   No orders of the defined types were placed in this encounter.     F/U  Return for Annual Physical, Pt to contact us if symptoms worsen. I spent 31 minutes involved in the care of this patient preparing to see the patient by obtaining and reviewing her medical history (including labs, imaging tests and prior procedures), documenting clinical information in the electronic health record (EHR), counseling and coordinating care plans, writing and sending prescriptions, ordering tests or procedures and in direct communicating with the patient and medical staff discussing pertinent items from her history and physical exam.  Elonda Husky, M.D. 01/01/2021 2:10  PM

## 2021-01-01 NOTE — Addendum Note (Signed)
Addended by: Tommie Raymond on: 01/01/2021 02:48 PM   Modules accepted: Orders

## 2021-01-05 LAB — URINE CULTURE

## 2021-01-16 ENCOUNTER — Encounter: Payer: Self-pay | Admitting: Obstetrics and Gynecology

## 2021-05-15 ENCOUNTER — Encounter: Payer: Self-pay | Admitting: Dietician

## 2021-05-15 ENCOUNTER — Encounter: Payer: BC Managed Care – PPO | Attending: Nurse Practitioner | Admitting: Dietician

## 2021-05-15 ENCOUNTER — Other Ambulatory Visit: Payer: Self-pay

## 2021-05-15 VITALS — Ht 65.0 in | Wt 286.0 lb

## 2021-05-15 DIAGNOSIS — R7303 Prediabetes: Secondary | ICD-10-CM | POA: Diagnosis not present

## 2021-05-15 DIAGNOSIS — Z6841 Body Mass Index (BMI) 40.0 and over, adult: Secondary | ICD-10-CM | POA: Diagnosis not present

## 2021-05-15 NOTE — Patient Instructions (Signed)
Increase low carb vegetables, plan to have enough on hand for dinner meals, go to restaurants that offer vegetables, mix veggies in with pasta, potatoes, or meats.  ?Decrease regular sodas and sweet tea by switching to diet, choosing 1/2-sweet tea or unsweetened with sugar free sweetener, and increasing water or lightly flavored.  ?Consider gradually increasing physical activity with more steps during the day, taking walks, or other light exercise. Start with a short time, like 15 minutes, and increase as energy increases.  ?

## 2021-05-15 NOTE — Progress Notes (Signed)
Medical Nutrition Therapy: Visit start time: 0835  end time: 0930 ?Assessment:  Diagnosis: pre-diabetes, obesity ?Past medical history: none significant ?Psychosocial issues/ stress concerns: high stress level, work related; patient feels she is not dealing well with her stress ? ?Preferred learning method:  ?Hands-on ? ? ?Current weight: 286.0lbs Height: 5'5"  BMI: 47.59 ? ?Medications, supplements: reconciled list in medical record ? ?Progress and evaluation:  ?Patient reports recent HbA1C of 5.8% (from patient portal) ?No recent diet or lifestyle changes. ?Work stress has been high, at times prompts extra eating.   ? ?Physical activity: no regular exercise, not since high school (color guard, cheerleading, softball at various times) ? ?Dietary Intake:  ?Usual eating pattern includes 3 meals and 1 snacks per day. ?Dining out frequency: 6-7 meals per week. ? ?Breakfast: banana or fruit if available in the home ?Snack: none ?Lunch: 12-1pm -- takeout, fast food with coworkers ?Snack: recently hot cheetos, enough to control hunger until dinner ?Supper: meat + green beans/ corn/ other + starch pasta/ potatoes/ bread, etc states she is picky with veg; often fast meal ie corn dogs, taquitos ?Snack: none, tries to avoid ?Beverages: 4-5c water, regular soda Dr. Reino Kent, sweet tea ? ?Intervention:  ? ?Nutrition Care Education: ?  ?Basic nutrition: basic food groups; appropriate nutrient balance; appropriate meal and snack schedule; general nutrition guidelines    ?Weight control: importance of low sugar and low fat choices; importance of portion control and appropriate food portions; role of physical activity ?Advanced nutrition:  food label reading for carbohydrates ?Diabetes prevention:  appropriate meal and snack schedule; appropriate carb intake and balance; healthy carb choices; role of fiber, protein, fat; effects of regular activity/ exercise ? ? ? ?Nutritional Diagnosis:  [place]-2.2 Altered nutrition-related laboratory  As related to pre-diabetes.  As evidenced by elevated HbA1C. ?[place]-3.3 Overweight/obesity As related to excess calories and inadequate physical activity, high stress level.  As evidenced by patient with current BMI of 47.59. ? ? ?Education Materials given:  ?Plate Planner with food lists, guidance for carb intake and food portions ?Sample menus ?Visit summary with goals/ instructions to be viewed via Mychart ? ? ?Learner/ who was taught:  ?Patient  ? ? ?Level of understanding: ?Verbalizes/ demonstrates competency ? ? ?Demonstrated degree of understanding via:   Teach back ?Learning barriers: ?None ? ?Willingness to learn/ readiness for change: ?Acceptance, ready for change ? ? ?Monitoring and Evaluation:  Dietary intake, exercise, BG control, and body weight ?     follow up: prn  ?

## 2021-06-19 ENCOUNTER — Ambulatory Visit: Payer: BC Managed Care – PPO | Admitting: Dietician

## 2021-08-13 ENCOUNTER — Ambulatory Visit (INDEPENDENT_AMBULATORY_CARE_PROVIDER_SITE_OTHER): Payer: BC Managed Care – PPO | Admitting: Obstetrics and Gynecology

## 2021-08-13 ENCOUNTER — Encounter: Payer: Self-pay | Admitting: Obstetrics and Gynecology

## 2021-08-13 VITALS — BP 130/80 | HR 93 | Ht 65.0 in | Wt 288.8 lb

## 2021-08-13 DIAGNOSIS — Z30432 Encounter for removal of intrauterine contraceptive device: Secondary | ICD-10-CM | POA: Diagnosis not present

## 2021-08-13 DIAGNOSIS — Z3009 Encounter for other general counseling and advice on contraception: Secondary | ICD-10-CM

## 2022-04-16 DIAGNOSIS — N921 Excessive and frequent menstruation with irregular cycle: Secondary | ICD-10-CM | POA: Insufficient documentation

## 2022-04-16 DIAGNOSIS — R5383 Other fatigue: Secondary | ICD-10-CM

## 2022-04-16 HISTORY — DX: Other fatigue: R53.83

## 2022-06-13 ENCOUNTER — Other Ambulatory Visit: Payer: Self-pay | Admitting: Nurse Practitioner

## 2022-06-13 ENCOUNTER — Other Ambulatory Visit: Payer: 59

## 2022-06-14 LAB — COMPREHENSIVE METABOLIC PANEL
ALT: 31 IU/L (ref 0–32)
AST: 24 IU/L (ref 0–40)
Albumin/Globulin Ratio: 2.8 — ABNORMAL HIGH (ref 1.2–2.2)
Albumin: 4.4 g/dL (ref 4.0–5.0)
Alkaline Phosphatase: 104 IU/L (ref 44–121)
BUN/Creatinine Ratio: 12 (ref 9–23)
BUN: 6 mg/dL (ref 6–20)
Bilirubin Total: 0.3 mg/dL (ref 0.0–1.2)
CO2: 21 mmol/L (ref 20–29)
Calcium: 9.3 mg/dL (ref 8.7–10.2)
Chloride: 104 mmol/L (ref 96–106)
Creatinine, Ser: 0.49 mg/dL — ABNORMAL LOW (ref 0.57–1.00)
Globulin, Total: 1.6 g/dL (ref 1.5–4.5)
Glucose: 85 mg/dL (ref 70–99)
Potassium: 4 mmol/L (ref 3.5–5.2)
Sodium: 140 mmol/L (ref 134–144)
Total Protein: 6 g/dL (ref 6.0–8.5)
eGFR: 133 mL/min/{1.73_m2} (ref >59)

## 2022-06-14 LAB — TSH: TSH: 1.73 u[IU]/mL (ref 0.450–4.500)

## 2022-06-14 LAB — HGB A1C W/O EAG: Hgb A1c MFr Bld: 6.2 % — ABNORMAL HIGH (ref 4.8–5.6)

## 2022-06-14 LAB — LIPID PANEL W/O CHOL/HDL RATIO
Cholesterol, Total: 173 mg/dL (ref 100–199)
HDL: 43 mg/dL (ref >39)
LDL Chol Calc (NIH): 113 mg/dL — ABNORMAL HIGH (ref 0–99)
Triglycerides: 91 mg/dL (ref 0–149)
VLDL Cholesterol Cal: 17 mg/dL (ref 5–40)

## 2022-06-17 ENCOUNTER — Encounter: Payer: Self-pay | Admitting: Nurse Practitioner

## 2022-06-17 ENCOUNTER — Ambulatory Visit (INDEPENDENT_AMBULATORY_CARE_PROVIDER_SITE_OTHER): Payer: 59 | Admitting: Nurse Practitioner

## 2022-06-17 ENCOUNTER — Other Ambulatory Visit: Payer: Self-pay | Admitting: Nurse Practitioner

## 2022-06-17 VITALS — BP 122/82 | HR 87 | Ht 65.0 in | Wt 280.2 lb

## 2022-06-17 DIAGNOSIS — G43009 Migraine without aura, not intractable, without status migrainosus: Secondary | ICD-10-CM

## 2022-06-17 DIAGNOSIS — E782 Mixed hyperlipidemia: Secondary | ICD-10-CM | POA: Diagnosis not present

## 2022-06-17 DIAGNOSIS — R7303 Prediabetes: Secondary | ICD-10-CM

## 2022-06-17 DIAGNOSIS — R5383 Other fatigue: Secondary | ICD-10-CM

## 2022-06-17 NOTE — Progress Notes (Signed)
Established Patient Office Visit  Subjective:  Patient ID: Taylor Wang, female    DOB: 30-Mar-1995  Age: 27 y.o. MRN: [id number]  Chief Complaint  Patient presents with   Follow-up    4 month follow up, discuss lab results.    4 month follow up and review of recent fasting labs.  Pt requesting information on attention deficit disorder.  LDL is at 113 mg/dl and W0J is at 8.1%    No other concerns at this time.   Past Medical History:  Diagnosis Date   Depression    Headache(784.0)     Past Surgical History:  Procedure Laterality Date   ADENOIDECTOMY, TONSILLECTOMY AND MYRINGOTOMY WITH TUBE PLACEMENT Bilateral 05/2002   APPENDECTOMY  2019   TYMPANOSTOMY TUBE PLACEMENT Bilateral 06/2006    Social History   Socioeconomic History   Marital status: Single    Spouse name: Not on file   Number of children: Not on file   Years of education: Not on file   Highest education level: Not on file  Occupational History   Not on file  Tobacco Use   Smoking status: Never   Smokeless tobacco: Never  Vaping Use   Vaping Use: Never used  Substance and Sexual Activity   Alcohol use: Never   Drug use: Never   Sexual activity: Yes    Partners: Male    Birth control/protection: Condom  Other Topics Concern   Not on file  Social History Narrative   Not on file   Social Determinants of Health   Financial Resource Strain: Not on file  Food Insecurity: Not on file  Transportation Needs: Not on file  Physical Activity: Not on file  Stress: Not on file  Social Connections: Not on file  Intimate Partner Violence: Not At Risk (09/02/2019)   Humiliation, Afraid, Rape, and Kick questionnaire    Fear of Current or Ex-Partner: No    Emotionally Abused: No    Physically Abused: No    Sexually Abused: No    Family History  Problem Relation Age of Onset   Hypertension Mother    ADD / ADHD Brother    Cancer Maternal Grandmother    Migraines Maternal Aunt     Allergies   Allergen Reactions   Other     Seasonal Allergies   Penicillins Swelling   Sulfa Antibiotics Swelling    Review of Systems  Constitutional: Negative.   HENT: Negative.    Eyes: Negative.   Respiratory: Negative.    Cardiovascular: Negative.   Gastrointestinal: Negative.   Genitourinary: Negative.   Musculoskeletal: Negative.   Skin: Negative.   Neurological: Negative.   Endo/Heme/Allergies: Negative.   Psychiatric/Behavioral: Negative.         Objective:   BP 122/82   Pulse 87   Ht 5\' 5"  (1.651 m)   Wt 280 lb 3.2 oz (127.1 kg)   SpO2 97%   BMI 46.63 kg/m   Vitals:   06/17/22 0854  BP: 122/82  Pulse: 87  Height: 5\' 5"  (1.651 m)  Weight: 280 lb 3.2 oz (127.1 kg)  SpO2: 97%  BMI (Calculated): 46.63    Physical Exam Vitals reviewed.  Constitutional:      Appearance: Normal appearance.  HENT:     Head: Normocephalic.     Nose: Nose normal.     Mouth/Throat:     Mouth: Mucous membranes are moist.  Eyes:     Pupils: Pupils are equal, round, and reactive to light.  Cardiovascular:     Rate and Rhythm: Normal rate and regular rhythm.  Pulmonary:     Effort: Pulmonary effort is normal.     Breath sounds: Normal breath sounds.  Abdominal:     General: Bowel sounds are normal.     Palpations: Abdomen is soft.  Musculoskeletal:        General: Normal range of motion.     Cervical back: Normal range of motion and neck supple.  Skin:    General: Skin is warm and dry.  Neurological:     Mental Status: She is alert and oriented to person, place, and time.  Psychiatric:        Mood and Affect: Mood normal.        Behavior: Behavior normal.      No results found for any visits on 06/17/22.  Recent Results (from the past 2160 hour(s))  Comprehensive metabolic panel     Status: Abnormal   Collection Time: 06/13/22 12:17 PM  Result Value Ref Range   Glucose 85 70 - 99 mg/dL   BUN 6 6 - 20 mg/dL   Creatinine, Ser 1.61 (L) 0.57 - 1.00 mg/dL   eGFR 096  >04 VW/UJW/1.19   BUN/Creatinine Ratio 12 9 - 23   Sodium 140 134 - 144 mmol/L   Potassium 4.0 3.5 - 5.2 mmol/L   Chloride 104 96 - 106 mmol/L   CO2 21 20 - 29 mmol/L   Calcium 9.3 8.7 - 10.2 mg/dL   Total Protein 6.0 6.0 - 8.5 g/dL   Albumin 4.4 4.0 - 5.0 g/dL   Globulin, Total 1.6 1.5 - 4.5 g/dL   Albumin/Globulin Ratio 2.8 (H) 1.2 - 2.2   Bilirubin Total 0.3 0.0 - 1.2 mg/dL   Alkaline Phosphatase 104 44 - 121 IU/L   AST 24 0 - 40 IU/L   ALT 31 0 - 32 IU/L  Lipid Panel w/o Chol/HDL Ratio     Status: Abnormal   Collection Time: 06/13/22 12:17 PM  Result Value Ref Range   Cholesterol, Total 173 100 - 199 mg/dL   Triglycerides 91 0 - 149 mg/dL   HDL 43 >14 mg/dL   VLDL Cholesterol Cal 17 5 - 40 mg/dL   LDL Chol Calc (NIH) 782 (H) 0 - 99 mg/dL  Hgb N5A w/o eAG     Status: Abnormal   Collection Time: 06/13/22 12:17 PM  Result Value Ref Range   Hgb A1c MFr Bld 6.2 (H) 4.8 - 5.6 %    Comment:          Prediabetes: 5.7 - 6.4          Diabetes: >6.4          Glycemic control for adults with diabetes: <7.0   TSH     Status: None   Collection Time: 06/13/22 12:17 PM  Result Value Ref Range   TSH 1.730 0.450 - 4.500 uIU/mL      Assessment & Plan:   Problem List Items Addressed This Visit       Cardiovascular and Mediastinum   Migraine without aura and without status migrainosus, not intractable - Primary     Other   Morbid obesity (HCC) 263 lb   Prediabetes   Mixed hyperlipidemia    No follow-ups on file.   Total time spent: 35 minutes  Orson Eva, NP  06/17/2022

## 2022-06-17 NOTE — Patient Instructions (Signed)
1) low fat, prediabetic diet 2) Follow up appt in 4 months, fasting labs prior

## 2022-10-13 ENCOUNTER — Other Ambulatory Visit: Payer: 59

## 2022-10-13 DIAGNOSIS — I10 Essential (primary) hypertension: Secondary | ICD-10-CM

## 2022-10-13 DIAGNOSIS — R7303 Prediabetes: Secondary | ICD-10-CM

## 2022-10-13 DIAGNOSIS — R5383 Other fatigue: Secondary | ICD-10-CM

## 2022-10-13 DIAGNOSIS — E782 Mixed hyperlipidemia: Secondary | ICD-10-CM

## 2022-10-14 LAB — CMP14+EGFR
ALT: 30 IU/L (ref 0–32)
AST: 20 IU/L (ref 0–40)
Albumin: 4.1 g/dL (ref 4.0–5.0)
Alkaline Phosphatase: 107 IU/L (ref 44–121)
BUN/Creatinine Ratio: 9 (ref 9–23)
BUN: 5 mg/dL — ABNORMAL LOW (ref 6–20)
Bilirubin Total: <0.2 mg/dL (ref 0.0–1.2)
CO2: 20 mmol/L (ref 20–29)
Calcium: 9.3 mg/dL (ref 8.7–10.2)
Chloride: 106 mmol/L (ref 96–106)
Creatinine, Ser: 0.54 mg/dL — ABNORMAL LOW (ref 0.57–1.00)
Globulin, Total: 1.9 g/dL (ref 1.5–4.5)
Glucose: 113 mg/dL — ABNORMAL HIGH (ref 70–99)
Potassium: 4.3 mmol/L (ref 3.5–5.2)
Sodium: 143 mmol/L (ref 134–144)
Total Protein: 6 g/dL (ref 6.0–8.5)
eGFR: 130 mL/min/{1.73_m2} (ref >59)

## 2022-10-14 LAB — CBC WITH DIFFERENTIAL/PLATELET
Basophils Absolute: 0 10*3/uL (ref 0.0–0.2)
Basos: 0 %
EOS (ABSOLUTE): 0.2 10*3/uL (ref 0.0–0.4)
Eos: 2 %
Hematocrit: 41.7 % (ref 34.0–46.6)
Hemoglobin: 13.5 g/dL (ref 11.1–15.9)
Immature Grans (Abs): 0 10*3/uL (ref 0.0–0.1)
Immature Granulocytes: 0 %
Lymphocytes Absolute: 2.4 10*3/uL (ref 0.7–3.1)
Lymphs: 26 %
MCH: 26.5 pg — ABNORMAL LOW (ref 26.6–33.0)
MCHC: 32.4 g/dL (ref 31.5–35.7)
MCV: 82 fL (ref 79–97)
Monocytes Absolute: 0.6 10*3/uL (ref 0.1–0.9)
Monocytes: 6 %
Neutrophils Absolute: 6.1 10*3/uL (ref 1.4–7.0)
Neutrophils: 66 %
Platelets: 309 10*3/uL (ref 150–450)
RBC: 5.09 x10E6/uL (ref 3.77–5.28)
RDW: 14.1 % (ref 11.7–15.4)
WBC: 9.3 10*3/uL (ref 3.4–10.8)

## 2022-10-14 LAB — LIPID PANEL
Chol/HDL Ratio: 4.1 ratio (ref 0.0–4.4)
Cholesterol, Total: 178 mg/dL (ref 100–199)
HDL: 43 mg/dL (ref >39)
LDL Chol Calc (NIH): 115 mg/dL — ABNORMAL HIGH (ref 0–99)
Triglycerides: 111 mg/dL (ref 0–149)
VLDL Cholesterol Cal: 20 mg/dL (ref 5–40)

## 2022-10-14 LAB — HEMOGLOBIN A1C
Est. average glucose Bld gHb Est-mCnc: 126 mg/dL
Hgb A1c MFr Bld: 6 % — ABNORMAL HIGH (ref 4.8–5.6)

## 2022-10-14 LAB — TSH: TSH: 1.65 u[IU]/mL (ref 0.450–4.500)

## 2022-10-17 ENCOUNTER — Ambulatory Visit (INDEPENDENT_AMBULATORY_CARE_PROVIDER_SITE_OTHER): Payer: 59 | Admitting: Family

## 2022-10-17 VITALS — BP 130/89 | HR 51 | Ht 65.0 in | Wt 278.6 lb

## 2022-10-17 DIAGNOSIS — E782 Mixed hyperlipidemia: Secondary | ICD-10-CM | POA: Diagnosis not present

## 2022-10-17 DIAGNOSIS — R7303 Prediabetes: Secondary | ICD-10-CM | POA: Diagnosis not present

## 2022-10-17 DIAGNOSIS — F325 Major depressive disorder, single episode, in full remission: Secondary | ICD-10-CM | POA: Diagnosis not present

## 2022-10-17 DIAGNOSIS — G43009 Migraine without aura, not intractable, without status migrainosus: Secondary | ICD-10-CM

## 2022-10-17 NOTE — Progress Notes (Unsigned)
Established Patient Office Visit  Subjective:  Patient ID: Taylor Wang, female    DOB: [birth year]  Age: 27 y.o. MRN: [id number]  Chief Complaint  Patient presents with   Follow-up    4 mo f/u    Patient is here today for her 4 months follow up.  She has been feeling well since last appointment.   She does not have additional concerns to discuss today.  Labs were done previously so we will review these today. She does not need refills.   I have reviewed her active problem list, medication list, allergies, notes from last encounter, lab results for her appointment today.    No other concerns at this time.   Past Medical History:  Diagnosis Date   Depression    Headache(784.0)     Past Surgical History:  Procedure Laterality Date   ADENOIDECTOMY, TONSILLECTOMY AND MYRINGOTOMY WITH TUBE PLACEMENT Bilateral 05/2002   APPENDECTOMY  2019   TYMPANOSTOMY TUBE PLACEMENT Bilateral 06/2006    Social History   Socioeconomic History   Marital status: Single    Spouse name: Not on file   Number of children: Not on file   Years of education: Not on file   Highest education level: Not on file  Occupational History   Not on file  Tobacco Use   Smoking status: Never   Smokeless tobacco: Never  Vaping Use   Vaping status: Never Used  Substance and Sexual Activity   Alcohol use: Never   Drug use: Never   Sexual activity: Yes    Partners: Male    Birth control/protection: Condom  Other Topics Concern   Not on file  Social History Narrative   Not on file   Social Determinants of Health   Financial Resource Strain: Not on file  Food Insecurity: Not on file  Transportation Needs: Not on file  Physical Activity: Not on file  Stress: Not on file  Social Connections: Not on file  Intimate Partner Violence: Not At Risk (04/16/2022)   Received from [facility], [facility]   Humiliation, Afraid, Rape, and Kick questionnaire    Fear of Current or Ex-Partner: No     Emotionally Abused: No    Physically Abused: No    Sexually Abused: No    Family History  Problem Relation Age of Onset   Hypertension Mother    ADD / ADHD Brother    Cancer Maternal Grandmother    Migraines Maternal Aunt     Allergies  Allergen Reactions   Other     Seasonal Allergies   Penicillins Swelling   Sulfa Antibiotics Swelling    Review of Systems  All other systems reviewed and are negative.      Objective:   BP 130/89   Pulse (!) 51   Ht 5\' 5"  (1.651 m)   Wt 278 lb 9.6 oz (126.4 kg)   SpO2 96%   BMI 46.36 kg/m   Vitals:   10/17/22 1113  BP: 130/89  Pulse: (!) 51  Height: 5\' 5"  (1.651 m)  Weight: 278 lb 9.6 oz (126.4 kg)  SpO2: 96%  BMI (Calculated): 46.36    Physical Exam Vitals and nursing note reviewed.  Constitutional:      Appearance: Normal appearance. She is normal weight.  HENT:     Head: Normocephalic.  Eyes:     Extraocular Movements: Extraocular movements intact.     Conjunctiva/sclera: Conjunctivae normal.     Pupils: Pupils are equal, round,  and reactive to light.  Cardiovascular:     Rate and Rhythm: Normal rate.     Pulses: Normal pulses.  Pulmonary:     Effort: Pulmonary effort is normal.  Musculoskeletal:        General: Normal range of motion.  Neurological:     General: No focal deficit present.     Mental Status: She is alert and oriented to person, place, and time. Mental status is at baseline.  Psychiatric:        Mood and Affect: Mood normal.        Behavior: Behavior normal.        Thought Content: Thought content normal.        Judgment: Judgment normal.    No results found for any visits on 10/17/22.  Recent Results (from the past 2160 hour(s))  Hemoglobin A1c     Status: Abnormal   Collection Time: 10/13/22 11:06 AM  Result Value Ref Range   Hgb A1c MFr Bld 6.0 (H) 4.8 - 5.6 %    Comment:          Prediabetes: 5.7 - 6.4          Diabetes: >6.4          Glycemic control for adults with diabetes:  <7.0    Est. average glucose Bld gHb Est-mCnc 126 mg/dL  TSH     Status: None   Collection Time: 10/13/22 11:06 AM  Result Value Ref Range   TSH 1.650 0.450 - 4.500 uIU/mL  CMP14+EGFR     Status: Abnormal   Collection Time: 10/13/22 11:06 AM  Result Value Ref Range   Glucose 113 (H) 70 - 99 mg/dL   BUN 5 (L) 6 - 20 mg/dL   Creatinine, Ser 4.69 (L) 0.57 - 1.00 mg/dL   eGFR 629 >52 WU/XLK/4.40   BUN/Creatinine Ratio 9 9 - 23   Sodium 143 134 - 144 mmol/L   Potassium 4.3 3.5 - 5.2 mmol/L   Chloride 106 96 - 106 mmol/L   CO2 20 20 - 29 mmol/L   Calcium 9.3 8.7 - 10.2 mg/dL   Total Protein 6.0 6.0 - 8.5 g/dL   Albumin 4.1 4.0 - 5.0 g/dL   Globulin, Total 1.9 1.5 - 4.5 g/dL   Bilirubin Total <1.0 0.0 - 1.2 mg/dL   Alkaline Phosphatase 107 44 - 121 IU/L   AST 20 0 - 40 IU/L   ALT 30 0 - 32 IU/L  Lipid panel     Status: Abnormal   Collection Time: 10/13/22 11:06 AM  Result Value Ref Range   Cholesterol, Total 178 100 - 199 mg/dL   Triglycerides 272 0 - 149 mg/dL   HDL 43 >53 mg/dL   VLDL Cholesterol Cal 20 5 - 40 mg/dL   LDL Chol Calc (NIH) 664 (H) 0 - 99 mg/dL   Chol/HDL Ratio 4.1 0.0 - 4.4 ratio    Comment:                                   T. Chol/HDL Ratio                                             Men  Women  1/2 Avg.Risk  3.4    3.3                                   Avg.Risk  5.0    4.4                                2X Avg.Risk  9.6    7.1                                3X Avg.Risk 23.4   11.0   CBC with Diff     Status: Abnormal   Collection Time: 10/13/22 11:06 AM  Result Value Ref Range   WBC 9.3 3.4 - 10.8 x10E3/uL   RBC 5.09 3.77 - 5.28 x10E6/uL   Hemoglobin 13.5 11.1 - 15.9 g/dL   Hematocrit 96.0 45.4 - 46.6 %   MCV 82 79 - 97 fL   MCH 26.5 (L) 26.6 - 33.0 pg   MCHC 32.4 31.5 - 35.7 g/dL   RDW 09.8 11.9 - 14.7 %   Platelets 309 150 - 450 x10E3/uL   Neutrophils 66 Not Estab. %   Lymphs 26 Not Estab. %   Monocytes 6 Not Estab.  %   Eos 2 Not Estab. %   Basos 0 Not Estab. %   Neutrophils Absolute 6.1 1.4 - 7.0 x10E3/uL   Lymphocytes Absolute 2.4 0.7 - 3.1 x10E3/uL   Monocytes Absolute 0.6 0.1 - 0.9 x10E3/uL   EOS (ABSOLUTE) 0.2 0.0 - 0.4 x10E3/uL   Basophils Absolute 0.0 0.0 - 0.2 x10E3/uL   Immature Granulocytes 0 Not Estab. %   Immature Grans (Abs) 0.0 0.0 - 0.1 x10E3/uL       Assessment & Plan:   Problem List Items Addressed This Visit       Active Problems   Migraine without aura and without status migrainosus, not intractable    Patient stable.  Well controlled with current therapy.   Continue current meds.        Morbid obesity (HCC) 263 lb    Continue current meds.  Will adjust as needed based on results.  The patient is asked to make an attempt to improve diet and exercise patterns to aid in medical management of this problem. Addressed importance of increasing and maintaining water intake.       Depression, major, dx'd 03/2019    Patient stable.  Well controlled with current therapy.   Continue current meds.       Prediabetes    Patient educated on foods that contain carbohydrates and the need to decrease intake.  We discussed prediabetes, and what it means and the need for strict dietary control to prevent progression to type 2 diabetes.  Advised to decrease intake of sugary drinks, including sodas, sweet tea, and some juices, and of starch and sugar heavy foods (ie., potatoes, rice, bread, pasta, desserts). She verbalizes understanding and agreement with the changes discussed today.  A1C Continues to be in prediabetic ranges.  Will reassess at follow up after next lab check.  Patient counseled on dietary choices and verbalized understanding.        Mixed hyperlipidemia - Primary    Checking labs today.  Continue current therapy for lipid control. Will modify as needed based on labwork results.  Return in about 4 months (around 02/16/2023) for F/U.   Total time  spent: 20 minutes  Miki Kins, FNP  10/17/2022   This document may have been prepared by [facility] Voice Recognition software and as such may include unintentional dictation errors.

## 2022-10-19 ENCOUNTER — Encounter: Payer: Self-pay | Admitting: Family

## 2022-10-19 NOTE — Assessment & Plan Note (Signed)
Patient stable.  Well controlled with current therapy.   Continue current meds.  

## 2022-10-19 NOTE — Assessment & Plan Note (Signed)

## 2022-10-19 NOTE — Assessment & Plan Note (Signed)
Continue current meds.  Will adjust as needed based on results.  The patient is asked to make an attempt to improve diet and exercise patterns to aid in medical management of this problem. Addressed importance of increasing and maintaining water intake.   

## 2022-10-19 NOTE — Assessment & Plan Note (Signed)
Checking labs today.  Continue current therapy for lipid control. Will modify as needed based on labwork results.  

## 2023-02-19 ENCOUNTER — Ambulatory Visit: Payer: 59 | Admitting: Family

## 2023-03-11 ENCOUNTER — Other Ambulatory Visit: Payer: 59

## 2023-03-11 DIAGNOSIS — I10 Essential (primary) hypertension: Secondary | ICD-10-CM

## 2023-03-11 DIAGNOSIS — E782 Mixed hyperlipidemia: Secondary | ICD-10-CM

## 2023-03-11 DIAGNOSIS — R7303 Prediabetes: Secondary | ICD-10-CM

## 2023-03-12 LAB — CMP14+EGFR
ALT: 30 [IU]/L (ref 0–32)
AST: 21 [IU]/L (ref 0–40)
Albumin: 4.4 g/dL (ref 4.0–5.0)
Alkaline Phosphatase: 105 [IU]/L (ref 44–121)
BUN/Creatinine Ratio: 22 (ref 9–23)
BUN: 11 mg/dL (ref 6–20)
Bilirubin Total: 0.4 mg/dL (ref 0.0–1.2)
CO2: 21 mmol/L (ref 20–29)
Calcium: 9.4 mg/dL (ref 8.7–10.2)
Chloride: 103 mmol/L (ref 96–106)
Creatinine, Ser: 0.51 mg/dL — ABNORMAL LOW (ref 0.57–1.00)
Globulin, Total: 1.6 g/dL (ref 1.5–4.5)
Glucose: 111 mg/dL — ABNORMAL HIGH (ref 70–99)
Potassium: 4.2 mmol/L (ref 3.5–5.2)
Sodium: 138 mmol/L (ref 134–144)
Total Protein: 6 g/dL (ref 6.0–8.5)
eGFR: 132 mL/min/{1.73_m2} (ref >59)

## 2023-03-12 LAB — CBC WITH DIFFERENTIAL/PLATELET
Basophils Absolute: 0 10*3/uL (ref 0.0–0.2)
Basos: 0 %
EOS (ABSOLUTE): 0.2 10*3/uL (ref 0.0–0.4)
Eos: 2 %
Hematocrit: 41.3 % (ref 34.0–46.6)
Hemoglobin: 13.1 g/dL (ref 11.1–15.9)
Immature Grans (Abs): 0 10*3/uL (ref 0.0–0.1)
Immature Granulocytes: 0 %
Lymphocytes Absolute: 2.4 10*3/uL (ref 0.7–3.1)
Lymphs: 28 %
MCH: 25.7 pg — ABNORMAL LOW (ref 26.6–33.0)
MCHC: 31.7 g/dL (ref 31.5–35.7)
MCV: 81 fL (ref 79–97)
Monocytes Absolute: 0.6 10*3/uL (ref 0.1–0.9)
Monocytes: 7 %
Neutrophils Absolute: 5.4 10*3/uL (ref 1.4–7.0)
Neutrophils: 63 %
Platelets: 312 10*3/uL (ref 150–450)
RBC: 5.09 x10E6/uL (ref 3.77–5.28)
RDW: 14 % (ref 11.7–15.4)
WBC: 8.6 10*3/uL (ref 3.4–10.8)

## 2023-03-12 LAB — LIPID PANEL
Chol/HDL Ratio: 4.6 {ratio} — ABNORMAL HIGH (ref 0.0–4.4)
Cholesterol, Total: 171 mg/dL (ref 100–199)
HDL: 37 mg/dL — ABNORMAL LOW (ref >39)
LDL Chol Calc (NIH): 113 mg/dL — ABNORMAL HIGH (ref 0–99)
Triglycerides: 116 mg/dL (ref 0–149)
VLDL Cholesterol Cal: 21 mg/dL (ref 5–40)

## 2023-03-12 LAB — HEMOGLOBIN A1C
Est. average glucose Bld gHb Est-mCnc: 128 mg/dL
Hgb A1c MFr Bld: 6.1 % — ABNORMAL HIGH (ref 4.8–5.6)

## 2023-03-13 ENCOUNTER — Ambulatory Visit (INDEPENDENT_AMBULATORY_CARE_PROVIDER_SITE_OTHER): Payer: 59 | Admitting: Family

## 2023-03-13 ENCOUNTER — Encounter: Payer: Self-pay | Admitting: Family

## 2023-03-13 VITALS — BP 110/80 | HR 87 | Ht 65.0 in | Wt 282.0 lb

## 2023-03-13 DIAGNOSIS — R7303 Prediabetes: Secondary | ICD-10-CM | POA: Diagnosis not present

## 2023-03-13 DIAGNOSIS — G43009 Migraine without aura, not intractable, without status migrainosus: Secondary | ICD-10-CM | POA: Diagnosis not present

## 2023-03-13 DIAGNOSIS — Z0130 Encounter for examination of blood pressure without abnormal findings: Secondary | ICD-10-CM

## 2023-03-13 DIAGNOSIS — E782 Mixed hyperlipidemia: Secondary | ICD-10-CM

## 2023-03-13 NOTE — Assessment & Plan Note (Signed)
Continue current meds.  Will adjust as needed based on results.  The patient is asked to make an attempt to improve diet and exercise patterns to aid in medical management of this problem. Addressed importance of increasing and maintaining water intake.

## 2023-03-13 NOTE — Assessment & Plan Note (Signed)
Patient stable.  Well controlled with current therapy.   Continue current meds.

## 2023-03-13 NOTE — Assessment & Plan Note (Signed)

## 2023-03-13 NOTE — Assessment & Plan Note (Signed)
Checking labs today.  Continue current therapy for lipid control. Will modify as needed based on labwork results.

## 2023-03-13 NOTE — Progress Notes (Signed)
Established Patient Office Visit  Subjective:  Patient ID: Taylor Wang, female    DOB: 01-Mar-1995  Age: 28 y.o. MRN: [id number]  Chief Complaint  Patient presents with   Follow-up    4 month follow up, discuss lab results.    Patient is here today for her 4 months follow up.  She has been feeling fairly well since last appointment.   She does have additional concerns to discuss today.  Labs are due today. She needs refills.   I have reviewed her active problem list, medication list, allergies, notes from last encounter, lab results for her appointment today.      No other concerns at this time.   Past Medical History:  Diagnosis Date   Acute appendicitis with localized peritonitis 01/06/2018   Depression    Fatigue 04/16/2022   Headache(784.0)     Past Surgical History:  Procedure Laterality Date   ADENOIDECTOMY, TONSILLECTOMY AND MYRINGOTOMY WITH TUBE PLACEMENT Bilateral 05/2002   APPENDECTOMY  2019   TYMPANOSTOMY TUBE PLACEMENT Bilateral 06/2006    Social History   Socioeconomic History   Marital status: Single    Spouse name: Not on file   Number of children: Not on file   Years of education: Not on file   Highest education level: Not on file  Occupational History   Not on file  Tobacco Use   Smoking status: Never   Smokeless tobacco: Never  Vaping Use   Vaping status: Never Used  Substance and Sexual Activity   Alcohol use: Never   Drug use: Never   Sexual activity: Yes    Partners: Male    Birth control/protection: Condom  Other Topics Concern   Not on file  Social History Narrative   Not on file   Social Drivers of Health   Financial Resource Strain: Not on file  Food Insecurity: Not on file  Transportation Needs: Not on file  Physical Activity: Not on file  Stress: Not on file  Social Connections: Not on file  Intimate Partner Violence: Not At Risk (04/16/2022)   Received from [facility], [facility]   Humiliation, Afraid,  Rape, and Kick questionnaire    Fear of Current or Ex-Partner: No    Emotionally Abused: No    Physically Abused: No    Sexually Abused: No    Family History  Problem Relation Age of Onset   Hypertension Mother    ADD / ADHD Brother    Cancer Maternal Grandmother    Migraines Maternal Aunt     Allergies  Allergen Reactions   Other     Seasonal Allergies   Penicillins Swelling   Sulfa Antibiotics Swelling    Review of Systems  All other systems reviewed and are negative.      Objective:   BP 110/80   Pulse 87   Ht 5\' 5"  (1.651 m)   Wt 282 lb (127.9 kg)   SpO2 97%   BMI 46.93 kg/m   Vitals:   03/13/23 1036  BP: 110/80  Pulse: 87  Height: 5\' 5"  (1.651 m)  Weight: 282 lb (127.9 kg)  SpO2: 97%  BMI (Calculated): 46.93    Physical Exam Vitals and nursing note reviewed.  Constitutional:      Appearance: Normal appearance. She is normal weight.  HENT:     Head: Normocephalic.  Eyes:     Extraocular Movements: Extraocular movements intact.     Conjunctiva/sclera: Conjunctivae normal.     Pupils:  Pupils are equal, round, and reactive to light.  Cardiovascular:     Rate and Rhythm: Normal rate.  Pulmonary:     Effort: Pulmonary effort is normal.  Neurological:     General: No focal deficit present.     Mental Status: She is alert and oriented to person, place, and time. Mental status is at baseline.  Psychiatric:        Mood and Affect: Mood normal.        Behavior: Behavior normal.        Thought Content: Thought content normal.        Judgment: Judgment normal.      No results found for any visits on 03/13/23.  Recent Results (from the past 2160 hours)  Lipid panel     Status: Abnormal   Collection Time: 03/11/23  9:37 AM  Result Value Ref Range   Cholesterol, Total 171 100 - 199 mg/dL   Triglycerides 409 0 - 149 mg/dL   HDL 37 (L) >81 mg/dL   VLDL Cholesterol Cal 21 5 - 40 mg/dL   LDL Chol Calc (NIH) 191 (H) 0 - 99 mg/dL   Chol/HDL Ratio  4.6 (H) 0.0 - 4.4 ratio    Comment:                                   T. Chol/HDL Ratio                                             Men  Women                               1/2 Avg.Risk  3.4    3.3                                   Avg.Risk  5.0    4.4                                2X Avg.Risk  9.6    7.1                                3X Avg.Risk 23.4   11.0   Hemoglobin A1c     Status: Abnormal   Collection Time: 03/11/23  9:37 AM  Result Value Ref Range   Hgb A1c MFr Bld 6.1 (H) 4.8 - 5.6 %    Comment:          Prediabetes: 5.7 - 6.4          Diabetes: >6.4          Glycemic control for adults with diabetes: <7.0    Est. average glucose Bld gHb Est-mCnc 128 mg/dL  CBC with Differential/Platelet     Status: Abnormal   Collection Time: 03/11/23  9:37 AM  Result Value Ref Range   WBC 8.6 3.4 - 10.8 x10E3/uL   RBC 5.09 3.77 - 5.28 x10E6/uL   Hemoglobin 13.1 11.1 - 15.9 g/dL   Hematocrit 47.8 29.5 - 46.6 %   MCV 81  79 - 97 fL   MCH 25.7 (L) 26.6 - 33.0 pg   MCHC 31.7 31.5 - 35.7 g/dL   RDW 16.1 09.6 - 04.5 %   Platelets 312 150 - 450 x10E3/uL   Neutrophils 63 Not Estab. %   Lymphs 28 Not Estab. %   Monocytes 7 Not Estab. %   Eos 2 Not Estab. %   Basos 0 Not Estab. %   Neutrophils Absolute 5.4 1.4 - 7.0 x10E3/uL   Lymphocytes Absolute 2.4 0.7 - 3.1 x10E3/uL   Monocytes Absolute 0.6 0.1 - 0.9 x10E3/uL   EOS (ABSOLUTE) 0.2 0.0 - 0.4 x10E3/uL   Basophils Absolute 0.0 0.0 - 0.2 x10E3/uL   Immature Granulocytes 0 Not Estab. %   Immature Grans (Abs) 0.0 0.0 - 0.1 x10E3/uL  CMP14+EGFR     Status: Abnormal   Collection Time: 03/11/23  9:37 AM  Result Value Ref Range   Glucose 111 (H) 70 - 99 mg/dL   BUN 11 6 - 20 mg/dL   Creatinine, Ser 4.09 (L) 0.57 - 1.00 mg/dL   eGFR 811 >91 YN/WGN/5.62   BUN/Creatinine Ratio 22 9 - 23   Sodium 138 134 - 144 mmol/L   Potassium 4.2 3.5 - 5.2 mmol/L   Chloride 103 96 - 106 mmol/L   CO2 21 20 - 29 mmol/L   Calcium 9.4 8.7 - 10.2 mg/dL    Total Protein 6.0 6.0 - 8.5 g/dL   Albumin 4.4 4.0 - 5.0 g/dL   Globulin, Total 1.6 1.5 - 4.5 g/dL   Bilirubin Total 0.4 0.0 - 1.2 mg/dL   Alkaline Phosphatase 105 44 - 121 IU/L   AST 21 0 - 40 IU/L   ALT 30 0 - 32 IU/L       Assessment & Plan:   Problem List Items Addressed This Visit       Cardiovascular and Mediastinum   Migraine without aura and without status migrainosus, not intractable   Patient stable.  Well controlled with current therapy.   Continue current meds.          Other   Morbid obesity (HCC)   Continue current meds.  Will adjust as needed based on results.  The patient is asked to make an attempt to improve diet and exercise patterns to aid in medical management of this problem. Addressed importance of increasing and maintaining water intake.        Prediabetes   A1C Continues to be in prediabetic ranges.  Will reassess at follow up after next lab check.  Patient counseled on dietary choices and verbalized understanding.  Patient educated on foods that contain carbohydrates and the need to decrease intake.  We discussed prediabetes, and what it means and the need for strict dietary control to prevent progression to type 2 diabetes.  Advised to decrease intake of sugary drinks, including sodas, sweet tea, and some juices, and of starch and sugar heavy foods (ie., potatoes, rice, bread, pasta, desserts). She verbalizes understanding and agreement with the changes discussed today.        Mixed hyperlipidemia - Primary   Checking labs today.  Continue current therapy for lipid control. Will modify as needed based on labwork results.         Return in about 4 months (around 07/11/2023) for F/U.   Total time spent: 30 minutes  Miki Kins, FNP  03/13/2023   This document may have been prepared by [facility] Voice Recognition software and as such may include unintentional  dictation errors.

## 2023-07-08 ENCOUNTER — Other Ambulatory Visit

## 2023-07-08 DIAGNOSIS — R7303 Prediabetes: Secondary | ICD-10-CM

## 2023-07-08 DIAGNOSIS — I10 Essential (primary) hypertension: Secondary | ICD-10-CM

## 2023-07-08 DIAGNOSIS — E782 Mixed hyperlipidemia: Secondary | ICD-10-CM

## 2023-07-09 ENCOUNTER — Ambulatory Visit: Payer: Self-pay

## 2023-07-09 LAB — CMP14+EGFR
ALT: 19 IU/L (ref 0–32)
AST: 12 IU/L (ref 0–40)
Albumin: 4.4 g/dL (ref 4.0–5.0)
Alkaline Phosphatase: 111 IU/L (ref 44–121)
BUN/Creatinine Ratio: 14 (ref 9–23)
BUN: 8 mg/dL (ref 6–20)
Bilirubin Total: 0.3 mg/dL (ref 0.0–1.2)
CO2: 21 mmol/L (ref 20–29)
Calcium: 9.5 mg/dL (ref 8.7–10.2)
Chloride: 102 mmol/L (ref 96–106)
Creatinine, Ser: 0.56 mg/dL — ABNORMAL LOW (ref 0.57–1.00)
Globulin, Total: 1.7 g/dL (ref 1.5–4.5)
Glucose: 112 mg/dL — ABNORMAL HIGH (ref 70–99)
Potassium: 4.3 mmol/L (ref 3.5–5.2)
Sodium: 141 mmol/L (ref 134–144)
Total Protein: 6.1 g/dL (ref 6.0–8.5)
eGFR: 128 mL/min/{1.73_m2} (ref >59)

## 2023-07-09 LAB — CBC WITH DIFFERENTIAL/PLATELET
Basophils Absolute: 0.1 10*3/uL (ref 0.0–0.2)
Basos: 1 %
EOS (ABSOLUTE): 0.1 10*3/uL (ref 0.0–0.4)
Eos: 1 %
Hematocrit: 40.9 % (ref 34.0–46.6)
Hemoglobin: 13.2 g/dL (ref 11.1–15.9)
Immature Grans (Abs): 0 10*3/uL (ref 0.0–0.1)
Immature Granulocytes: 0 %
Lymphocytes Absolute: 2.4 10*3/uL (ref 0.7–3.1)
Lymphs: 24 %
MCH: 26.7 pg (ref 26.6–33.0)
MCHC: 32.3 g/dL (ref 31.5–35.7)
MCV: 83 fL (ref 79–97)
Monocytes Absolute: 0.7 10*3/uL (ref 0.1–0.9)
Monocytes: 6 %
Neutrophils Absolute: 6.9 10*3/uL (ref 1.4–7.0)
Neutrophils: 68 %
Platelets: 295 10*3/uL (ref 150–450)
RBC: 4.94 x10E6/uL (ref 3.77–5.28)
RDW: 14.6 % (ref 11.7–15.4)
WBC: 10.1 10*3/uL (ref 3.4–10.8)

## 2023-07-09 LAB — HEMOGLOBIN A1C
Est. average glucose Bld gHb Est-mCnc: 123 mg/dL
Hgb A1c MFr Bld: 5.9 % — ABNORMAL HIGH (ref 4.8–5.6)

## 2023-07-09 LAB — LIPID PANEL
Chol/HDL Ratio: 4 ratio (ref 0.0–4.4)
Cholesterol, Total: 173 mg/dL (ref 100–199)
HDL: 43 mg/dL (ref >39)
LDL Chol Calc (NIH): 113 mg/dL — ABNORMAL HIGH (ref 0–99)
Triglycerides: 94 mg/dL (ref 0–149)
VLDL Cholesterol Cal: 17 mg/dL (ref 5–40)

## 2023-07-10 ENCOUNTER — Encounter: Payer: Self-pay | Admitting: Family

## 2023-07-10 ENCOUNTER — Ambulatory Visit (INDEPENDENT_AMBULATORY_CARE_PROVIDER_SITE_OTHER): Payer: 59 | Admitting: Family

## 2023-07-10 VITALS — BP 114/84 | HR 83 | Ht 65.0 in | Wt 277.0 lb

## 2023-07-10 DIAGNOSIS — E782 Mixed hyperlipidemia: Secondary | ICD-10-CM | POA: Diagnosis not present

## 2023-07-10 DIAGNOSIS — Z0130 Encounter for examination of blood pressure without abnormal findings: Secondary | ICD-10-CM

## 2023-07-10 DIAGNOSIS — F325 Major depressive disorder, single episode, in full remission: Secondary | ICD-10-CM

## 2023-07-10 DIAGNOSIS — R7303 Prediabetes: Secondary | ICD-10-CM

## 2023-07-10 NOTE — Progress Notes (Signed)
 Established Patient Office Visit  Subjective:  Patient ID: Taylor Wang, female    DOB: 1995/06/28  Age: 28 y.o. MRN: [id number]  Chief Complaint  Patient presents with   Follow-up    4 month follow up    Patient is here today for her 4 months follow up.  She has been feeling well since last appointment.   She does not have additional concerns to discuss today.  Labs were done prior to appointment, will discuss in detail today.  She needs refills.   I have reviewed her active problem list, medication list, allergies, health maintenance, notes from last encounter, lab results for her appointment today.      No other concerns at this time.   Past Medical History:  Diagnosis Date   Acute appendicitis with localized peritonitis 01/06/2018   Depression    Fatigue 04/16/2022   Headache(784.0)     Past Surgical History:  Procedure Laterality Date   ADENOIDECTOMY, TONSILLECTOMY AND MYRINGOTOMY WITH TUBE PLACEMENT Bilateral 05/2002   APPENDECTOMY  2019   TYMPANOSTOMY TUBE PLACEMENT Bilateral 06/2006    Social History   Socioeconomic History   Marital status: Single    Spouse name: Not on file   Number of children: Not on file   Years of education: Not on file   Highest education level: Not on file  Occupational History   Not on file  Tobacco Use   Smoking status: Never   Smokeless tobacco: Never  Vaping Use   Vaping status: Never Used  Substance and Sexual Activity   Alcohol use: Never   Drug use: Never   Sexual activity: Yes    Partners: Male    Birth control/protection: Condom  Other Topics Concern   Not on file  Social History Narrative   Not on file   Social Drivers of Health   Financial Resource Strain: Not on file  Food Insecurity: Not on file  Transportation Needs: Not on file  Physical Activity: Not on file  Stress: Not on file  Social Connections: Not on file  Intimate Partner Violence: Not At Risk (04/16/2022)   Received from [facility], [facility]   Humiliation, Afraid, Rape, and Kick questionnaire    Fear of Current or Ex-Partner: No    Emotionally Abused: No    Physically Abused: No    Sexually Abused: No    Family History  Problem Relation Age of Onset   Hypertension Mother    ADD / ADHD Brother    Cancer Maternal Grandmother    Migraines Maternal Aunt     Allergies  Allergen Reactions   Other     Seasonal Allergies   Penicillins Swelling   Sulfa Antibiotics Swelling    Review of Systems  All other systems reviewed and are negative.      Objective:   BP 114/84   Pulse 83   Ht 5' 5 (1.651 m)   Wt 277 lb (125.6 kg)   SpO2 95%   BMI 46.10 kg/m   Vitals:   07/10/23 1055  BP: 114/84  Pulse: 83  Height: 5' 5 (1.651 m)  Weight: 277 lb (125.6 kg)  SpO2: 95%  BMI (Calculated): 46.1    Physical Exam Vitals and nursing note reviewed.  Constitutional:      Appearance: Normal appearance. She is normal weight.  HENT:     Head: Normocephalic and atraumatic.  Eyes:     Extraocular Movements: Extraocular movements intact.  Conjunctiva/sclera: Conjunctivae normal.     Pupils: Pupils are equal, round, and reactive to light.  Cardiovascular:     Rate and Rhythm: Normal rate.  Pulmonary:     Effort: Pulmonary effort is normal.  Musculoskeletal:        General: Normal range of motion.  Neurological:     General: No focal deficit present.     Mental Status: She is alert and oriented to person, place, and time. Mental status is at baseline.  Psychiatric:        Mood and Affect: Mood normal.        Behavior: Behavior normal.        Thought Content: Thought content normal.        Judgment: Judgment normal.      No results found for any visits on 07/10/23.  Recent Results (from the past 2160 hours)  CMP14+EGFR     Status: Abnormal   Collection Time: 07/08/23  8:44 AM  Result Value Ref Range   Glucose 112 (H) 70 - 99 mg/dL   BUN 8 6 - 20 mg/dL   Creatinine, Ser 9.43 (L)  0.57 - 1.00 mg/dL   eGFR 871 >40 fO/fpw/8.26   BUN/Creatinine Ratio 14 9 - 23   Sodium 141 134 - 144 mmol/L   Potassium 4.3 3.5 - 5.2 mmol/L   Chloride 102 96 - 106 mmol/L   CO2 21 20 - 29 mmol/L   Calcium 9.5 8.7 - 10.2 mg/dL   Total Protein 6.1 6.0 - 8.5 g/dL   Albumin 4.4 4.0 - 5.0 g/dL   Globulin, Total 1.7 1.5 - 4.5 g/dL   Bilirubin Total 0.3 0.0 - 1.2 mg/dL   Alkaline Phosphatase 111 44 - 121 IU/L   AST 12 0 - 40 IU/L   ALT 19 0 - 32 IU/L  Lipid panel     Status: Abnormal   Collection Time: 07/08/23  8:44 AM  Result Value Ref Range   Cholesterol, Total 173 100 - 199 mg/dL   Triglycerides 94 0 - 149 mg/dL   HDL 43 >60 mg/dL   VLDL Cholesterol Cal 17 5 - 40 mg/dL   LDL Chol Calc (NIH) 886 (H) 0 - 99 mg/dL   Chol/HDL Ratio 4.0 0.0 - 4.4 ratio    Comment:                                   T. Chol/HDL Ratio                                             Men  Women                               1/2 Avg.Risk  3.4    3.3                                   Avg.Risk  5.0    4.4                                2X Avg.Risk  9.6    7.1  3X Avg.Risk 23.4   11.0   CBC with Differential/Platelet     Status: None   Collection Time: 07/08/23  8:44 AM  Result Value Ref Range   WBC 10.1 3.4 - 10.8 x10E3/uL   RBC 4.94 3.77 - 5.28 x10E6/uL   Hemoglobin 13.2 11.1 - 15.9 g/dL   Hematocrit 59.0 65.9 - 46.6 %   MCV 83 79 - 97 fL   MCH 26.7 26.6 - 33.0 pg   MCHC 32.3 31.5 - 35.7 g/dL   RDW 85.3 88.2 - 84.5 %   Platelets 295 150 - 450 x10E3/uL   Neutrophils 68 Not Estab. %   Lymphs 24 Not Estab. %   Monocytes 6 Not Estab. %   Eos 1 Not Estab. %   Basos 1 Not Estab. %   Neutrophils Absolute 6.9 1.4 - 7.0 x10E3/uL   Lymphocytes Absolute 2.4 0.7 - 3.1 x10E3/uL   Monocytes Absolute 0.7 0.1 - 0.9 x10E3/uL   EOS (ABSOLUTE) 0.1 0.0 - 0.4 x10E3/uL   Basophils Absolute 0.1 0.0 - 0.2 x10E3/uL   Immature Granulocytes 0 Not Estab. %   Immature Grans (Abs) 0.0 0.0 - 0.1  x10E3/uL  Hemoglobin A1c     Status: Abnormal   Collection Time: 07/08/23  8:44 AM  Result Value Ref Range   Hgb A1c MFr Bld 5.9 (H) 4.8 - 5.6 %    Comment:          Prediabetes: 5.7 - 6.4          Diabetes: >6.4          Glycemic control for adults with diabetes: <7.0    Est. average glucose Bld gHb Est-mCnc 123 mg/dL       Assessment & Plan Prediabetes A1C Continues to be in prediabetic ranges.  Will reassess at follow up after next lab check.  Patient counseled on dietary choices and verbalized understanding.   Mixed hyperlipidemia Continue current therapy for lipid control. Will modify as needed based on labwork results.   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  Well controlled with current therapy.   Continue current meds.   Morbid obesity (HCC) Continue current meds.  Will adjust as needed based on results.  The patient is asked to make an attempt to improve diet and exercise patterns to aid in medical management of this problem. Addressed importance of increasing and maintaining water intake.      Return in about 4 months (around 11/10/2023).   Total time spent: 20 minutes  ALAN CHRISTELLA ARRANT, FNP  07/10/2023   This document may have been prepared by [facility] Voice Recognition software and as such may include unintentional dictation errors.

## 2023-09-20 NOTE — Assessment & Plan Note (Signed)
 Patient stable.  Well controlled with current therapy.   Continue current meds.

## 2023-09-20 NOTE — Assessment & Plan Note (Signed)
 Continue current therapy for lipid control. Will modify as needed based on labwork results.

## 2023-09-20 NOTE — Assessment & Plan Note (Signed)
 Continue current meds.  Will adjust as needed based on results.  The patient is asked to make an attempt to improve diet and exercise patterns to aid in medical management of this problem. Addressed importance of increasing and maintaining water intake.

## 2023-09-20 NOTE — Assessment & Plan Note (Signed)
 A1C Continues to be in prediabetic ranges.  Will reassess at follow up after next lab check.  Patient counseled on dietary choices and verbalized understanding.

## 2024-01-13 ENCOUNTER — Ambulatory Visit: Admitting: Family
